# Patient Record
Sex: Female | Born: 1970 | Race: White | Hispanic: No | Marital: Married | Smoking: Former smoker
Health system: Southern US, Community
[De-identification: ages and names within clinical notes are randomized; demographics above are authoritative.]

## PROBLEM LIST (undated history)

## (undated) DIAGNOSIS — I219 Acute myocardial infarction, unspecified: Secondary | ICD-10-CM

## (undated) DIAGNOSIS — E7800 Pure hypercholesterolemia, unspecified: Secondary | ICD-10-CM

## (undated) DIAGNOSIS — I10 Essential (primary) hypertension: Secondary | ICD-10-CM

## (undated) HISTORY — PX: TUBAL LIGATION: SHX77

---

## 2011-05-28 ENCOUNTER — Inpatient Hospital Stay (HOSPITAL_COMMUNITY)
Admission: EM | Admit: 2011-05-28 | Discharge: 2011-05-30 | DRG: 282 | Disposition: A | Discharge status: Home / Self Care | Payer: Medicaid Other | Attending: Cardiology | Admitting: Cardiology

## 2011-05-28 ENCOUNTER — Emergency Department (HOSPITAL_COMMUNITY): Payer: Medicaid Other

## 2011-05-28 DIAGNOSIS — Z23 Encounter for immunization: Secondary | ICD-10-CM

## 2011-05-28 DIAGNOSIS — I214 Non-ST elevation (NSTEMI) myocardial infarction: Principal | ICD-10-CM | POA: Diagnosis present

## 2011-05-28 DIAGNOSIS — F17200 Nicotine dependence, unspecified, uncomplicated: Secondary | ICD-10-CM | POA: Diagnosis present

## 2011-05-28 DIAGNOSIS — I2510 Atherosclerotic heart disease of native coronary artery without angina pectoris: Secondary | ICD-10-CM | POA: Diagnosis present

## 2011-05-28 DIAGNOSIS — Z7902 Long term (current) use of antithrombotics/antiplatelets: Secondary | ICD-10-CM

## 2011-05-28 DIAGNOSIS — Z8249 Family history of ischemic heart disease and other diseases of the circulatory system: Secondary | ICD-10-CM

## 2011-05-28 DIAGNOSIS — Z79899 Other long term (current) drug therapy: Secondary | ICD-10-CM

## 2011-05-28 DIAGNOSIS — I498 Other specified cardiac arrhythmias: Secondary | ICD-10-CM | POA: Diagnosis present

## 2011-05-28 DIAGNOSIS — Z7982 Long term (current) use of aspirin: Secondary | ICD-10-CM

## 2011-05-28 DIAGNOSIS — E785 Hyperlipidemia, unspecified: Secondary | ICD-10-CM | POA: Diagnosis present

## 2011-05-28 LAB — COMPREHENSIVE METABOLIC PANEL
ALT: 10 U/L (ref 0–35)
AST: 20 U/L (ref 0–37)
Albumin: 3.9 g/dL (ref 3.5–5.2)
Alkaline Phosphatase: 55 U/L (ref 39–117)
Chloride: 103 mEq/L (ref 96–112)
Potassium: 3.3 mEq/L — ABNORMAL LOW (ref 3.5–5.1)
Sodium: 138 mEq/L (ref 135–145)
Total Bilirubin: 0.1 mg/dL — ABNORMAL LOW (ref 0.3–1.2)
Total Protein: 7.7 g/dL (ref 6.0–8.3)

## 2011-05-28 LAB — CBC
Hemoglobin: 10.8 g/dL — ABNORMAL LOW (ref 12.0–15.0)
MCH: 25.7 pg — ABNORMAL LOW (ref 26.0–34.0)
MCH: 26.4 pg (ref 26.0–34.0)
MCHC: 32.1 g/dL (ref 30.0–36.0)
MCV: 82.3 fL (ref 78.0–100.0)
Platelets: 266 10*3/uL (ref 150–400)
Platelets: 248 10*3/uL (ref 150–400)
RBC: 4.2 MIL/uL (ref 3.87–5.11)
WBC: 6.7 10*3/uL (ref 4.0–10.5)

## 2011-05-28 LAB — POCT I-STAT TROPONIN I
Troponin i, poc: 0.94 ng/mL (ref 0.00–0.08)
Troponin i, poc: 1.46 ng/mL (ref 0.00–0.08)

## 2011-05-28 LAB — CARDIAC PANEL(CRET KIN+CKTOT+MB+TROPI)
Relative Index: 8.2 — ABNORMAL HIGH (ref 0.0–2.5)
Total CK: 239 U/L — ABNORMAL HIGH (ref 7–177)
Troponin I: 4.4 ng/mL (ref <0.30)

## 2011-05-28 LAB — MRSA PCR SCREENING: MRSA by PCR: NEGATIVE

## 2011-05-28 LAB — DIFFERENTIAL
Basophils Relative: 0 % (ref 0–1)
Eosinophils Absolute: 0.1 10*3/uL (ref 0.0–0.7)
Lymphs Abs: 1.8 10*3/uL (ref 0.7–4.0)
Monocytes Absolute: 0.4 10*3/uL (ref 0.1–1.0)
Monocytes Relative: 7 % (ref 3–12)

## 2011-05-28 LAB — TSH: TSH: 5.418 u[IU]/mL — ABNORMAL HIGH (ref 0.350–4.500)

## 2011-05-28 LAB — PROTIME-INR
INR: 1 (ref 0.00–1.49)
Prothrombin Time: 13.4 seconds (ref 11.6–15.2)

## 2011-05-28 LAB — HEPARIN LEVEL (UNFRACTIONATED): Heparin Unfractionated: 0.24 IU/mL — ABNORMAL LOW (ref 0.30–0.70)

## 2011-05-29 LAB — LIPID PANEL
HDL: 42 mg/dL (ref >39)
LDL Cholesterol: 159 mg/dL — ABNORMAL HIGH (ref 0–99)
Total CHOL/HDL Ratio: 5.3 RATIO
Triglycerides: 111 mg/dL (ref <150)
VLDL: 22 mg/dL (ref 0–40)

## 2011-05-29 LAB — CARDIAC PANEL(CRET KIN+CKTOT+MB+TROPI)
Relative Index: 6.8 — ABNORMAL HIGH (ref 0.0–2.5)
Total CK: 165 U/L (ref 7–177)

## 2011-05-29 LAB — CBC
HCT: 34 % — ABNORMAL LOW (ref 36.0–46.0)
Hemoglobin: 10.7 g/dL — ABNORMAL LOW (ref 12.0–15.0)
MCV: 82.9 fL (ref 78.0–100.0)
RDW: 15.8 % — ABNORMAL HIGH (ref 11.5–15.5)
WBC: 7.5 10*3/uL (ref 4.0–10.5)

## 2011-05-29 LAB — BASIC METABOLIC PANEL
BUN: 10 mg/dL (ref 6–23)
CO2: 25 mEq/L (ref 19–32)
Chloride: 105 mEq/L (ref 96–112)
Glucose, Bld: 72 mg/dL (ref 70–99)
Potassium: 3.8 mEq/L (ref 3.5–5.1)
Sodium: 138 mEq/L (ref 135–145)

## 2011-05-30 LAB — CBC
HCT: 33.5 % — ABNORMAL LOW (ref 36.0–46.0)
Hemoglobin: 10.6 g/dL — ABNORMAL LOW (ref 12.0–15.0)
MCH: 26.1 pg (ref 26.0–34.0)
MCHC: 31.6 g/dL (ref 30.0–36.0)
MCV: 82.5 fL (ref 78.0–100.0)
RBC: 4.06 MIL/uL (ref 3.87–5.11)

## 2011-05-30 LAB — BASIC METABOLIC PANEL
CO2: 23 mEq/L (ref 19–32)
Calcium: 9.1 mg/dL (ref 8.4–10.5)
Potassium: 3.5 mEq/L (ref 3.5–5.1)
Sodium: 139 mEq/L (ref 135–145)

## 2011-05-30 LAB — CARDIAC PANEL(CRET KIN+CKTOT+MB+TROPI)
Relative Index: 3.5 — ABNORMAL HIGH (ref 0.0–2.5)
Total CK: 108 U/L (ref 7–177)

## 2011-06-01 NOTE — Cardiovascular Report (Signed)
Veronica Zamora, Veronica Zamora NO.:  [id number]  MEDICAL RECORD NO.:  [id number]  LOCATION:  2926                         FACILITY:  [facility]  PHYSICIAN:  Landry Corporal, MD DATE OF BIRTH:  19-Feb-1971  DATE OF PROCEDURE:  05/29/2011 DATE OF DISCHARGE:  05/30/2011                           CARDIAC CATHETERIZATION   PRIMARY CARDIOLOGIST:  Italy Hilty, MD  PERFORMING PHYSICIAN:  Landry Corporal, MD  PROCEDURE PERFORMED: 1. Left heart catheterization via 5-French right femoral artery     access. 2. Left ventriculography in the RAO projection, 11 mL contrast per     second 33 mL. 3. Native coronary angiography. 4. Intracoronary nitroglycerin injection of 200 mcg into the left     coronary artery system.  INDICATIONS:  Not-ST elevation MI.  BRIEF HISTORY:  Veronica Zamora is a very pleasant 40 year old woman with a history of smoking and family history of significant coronary artery disease who presented to [facility] Emergency Room with anginal-type chest pain and ruled in for a non-ST elevation MI with a troponin peak of roughly 4.  She had nonspecific ST-T changes and was pain-free upon evaluation in the emergency room.  She remained pain-free overnight, was treated with intravenous heparin and aspirin as well as beta-blockers and statin.  She is now referred for diagnostic angiography.  The risks, benefits, alternatives, and indications of the procedure were explained to the patient in detail.  Informed consent was obtained and a signed form placed on the chart.  PROCEDURE:  The patient was brought to the second floor [place] cardiac catheterization lab in fasting state.  She was prepped and draped in usual in sterile fashion with femoral artery access.  After time-out period was performed, the patient was sedated with intravenous Versed and fentanyl, and the right femoral head was localized using tactile and fluoroscopic guidance.  The right groin was  anesthetized using 1% subcutaneous lidocaine, and the right common femoral artery was accessed using modified Seldinger technique with placement of 5-French sheath.  After the sheath was aspirated and flushed,  first a 5-French JL-4 followed by 5-French JR-4 catheter was advanced over a wire. Multiple angiographic views of first the left and the right coronary arteries.  Then JR-4 catheter was exchanged over wire for a 5-French pigtail catheter, which was advanced across the aortic valve measuring the left ventricular hemodynamics.  A left ventriculogram was then performed in the RAO projection and then left ventricular hemodynamics were remeasured and the catheter was pulled back across the aortic valve for measuring pullback gradient.  Upon initial reviewing of the angiographic images, decision was made to reintroduce the JL4 catheter, so the pigtail catheter was changed for JL- 4 catheter and again a couple of additional angiography images of the left coronary system were obtained, and intracoronary nitroglycerin was administered prior to these evaluations.  After this, the catheter was removed completely out of the body over the wire, and the patient transferred to the holding area for sheath removal.  She was stable for during and after the procedure.  There were no complications.  ESTIMATED BLOOD LOSS:  Less 10 mL.  CATHETERIZATION STATISTICS: 1. Sedation was 2 mg of Versed and  50 mcg of fentanyl. 2. Total contrast is 140 mL.  HEMODYNAMICS: 1. Central aortic pressure 113/67 mmHg with a mean of 88 mmHg. 2. Left ventricular pressure 110/4 mmHg with an EDP of . 3. Left ventriculography demonstrated vigorous left ventricular     contraction with an ejection fraction of at least 65% with no     regional wall motion abnormalities.  ANGIOGRAPHIC FINDINGS: 1. Right coronary is a large caliber dominant vessel giving rise to     posterior ascending artery and posterolateral  branch.  Just in the     proximal segment, there is a somewhat eccentric lesion that appears     to be roughly 50% non-flow limiting that was potential for a     culprit lesion, but currently not flow limiting.  The remainder of     the vessel had no significant disease. 2. The left main is a moderate large-caliber vessel bifurcates     normally into an LAD and circumflex artery.  There is no     significant disease in this vessel. 3. Left anterior descending artery is a moderate-to-large caliber     vessel reached down and around the apex.  It gives rise to a very     small first diagonal branch, which does appear to be stumped off in     the early portion.  There is a similar branch of same caliber from     the proximal portion of the circumflex, which does have a     relatively large distribution of the covers, which would suggest     this is also another potential culprit for the MI.  There are two     additional diagonal branches and one major septal perforator as     well as several smaller branches with no significant disease in     rest of these vessels. 4. Circumflex is a large caliber vessel, but as I mentioned there is a    very small first obtuse marginal that is similar in caliber with a     very small first diagonal branch.  After a somewhat tortuous     course, this vessel becomes essentially trifurcating obtuse     marginal with a very very small atrioventricular groove branch.     There is no significant disease in this vessel.  IMPRESSION: 1. Moderate non-flow limiting plaque in the right coronary artery with     no inferior wall motion abnormality. 2. Another potential culprit lesion in the small first diagonal     second, which appears to be stumped off as this is a less than 1-mm     vessel. 3. Well-preserved ventricular function with no notable wall motion     abnormalities.  PLAN:  The patient will return back to her step-down bed, will be continued on the  cardiac medications.  We will continue her on anticoagulation overnight.  We will also continue her on dual antiplatelet therapy for least 1 month preferably for 1 year based on the CURE trial.  So, we will therefore start her on aspirin and Plavix.  We will likely transfer her to telemetry either this afternoon or in the morning and continue to titrate up her beta-blocker, statin, and ACE inhibitor.  We anticipate fast-track discharge.          ______________________________ Landry Corporal, MD     DWH/MEDQ  D:  05/30/2011  T:  05/31/2011  Job:  [id number]  cc:   Italy Hilty,  MD [facility]  Electronically Signed by Bryan Lemma MD on 06/01/2011 09:31:50 PM

## 2011-06-05 NOTE — H&P (Signed)
NAMEDANALEE, FLATH NO.:  [id number]  MEDICAL RECORD NO.:  [id number]  LOCATION:  [facility]                         FACILITY:  [facility]  PHYSICIAN:  Landry Corporal, MD DATE OF BIRTH:  10/29/1970  DATE OF ADMISSION:  05/28/2011 DATE OF DISCHARGE:                             HISTORY & PHYSICAL   CHIEF COMPLAINTS:  Chest pain and left arm pain.  HISTORY OF PRESENT ILLNESS:  Ms. Riva is a 40 year old female whose husband is a patient of Dr. Gaspar Garbe Little's.  She presented to the emergency room today with complaints of midsternal chest discomfort since last night.  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 She said she felt short of breath and "like I was going to pass out." She had symptoms of indigestion off and on all night.  She came to the emergency room today.  Her EKG shows nonspecific changes and her first troponin point-of-care marker is positive at 0.93.  She has multiple risk factors for coronary disease and is admitted now for further evaluation.  She continues to have a low level of midsternal chest discomfort.  PAST MEDICAL HISTORY:  Remarkable for dyslipidemia.  She has had no major surgeries and denies any history of diabetes.  She takes no medications at home except for a multivitamin.  SOCIAL HISTORY:  She is engaged to be married.  She has 2 children.  She smokes a pack a day.  There is no history of alcohol or drug abuse.  FAMILY HISTORY:  Remarkable that her father had an MI at 28, she also has a brother who had a stent placed at 83.  REVIEW OF SYSTEMS:  She says she felt fatigued lately.  She has not had reflux symptoms in the past and last night was the first time, she has ever had some indigestion-type symptoms.  She does wear glasses.  PHYSICAL EXAM:  VITAL SIGNS:  Blood pressure 122/60, pulse 78, temperature 98. GENERAL:  She is a well-developed,  well-nourished female in no acute distress. HEENT:  Normocephalic, atraumatic.  Extraocular movements intact. Sclarea are nonicteric. She wears glasses. NECK:  Without JVD or bruit. CHEST:  Clear to auscultation percussion. CARDIAC:  Regular rate and rhythm without murmur, rub, or gallop. Normal S1 and S2. ABDOMEN:  Nontender, nondistended.  No bruits. EXTREMITIES:  Without edema.  Distal pulses are intact, 3+/4 bilaterally.  There is no femoral artery bruits noted. NEUROLOGIC:  Grossly intact.  She is awake, alert, oriented, operative. Moves all extremities without obvious deficit. SKIN:  Cool and dry.  LABORATORY DATA:  White count 6.7, hemoglobin 10.8, hematocrit 34.3, platelets 266, troponin 0.94.  Sodium 138, potassium 3.3, BUN 18, creatinine 0.6.  Chest x-ray, portable showed a pericardial fat pad and a PA and lateral was recommended, this is pending.  The EKG shows sinus rhythm with poor anterior R-wave progression, but no acute changes.  IMPRESSION: 1. Unstable angina. 2. Untreated dyslipidemia. 3. Smoking. 4. A strong family history of coronary disease.  PLAN:  The patient will be started on IV heparin.  She is already on a nitro paste.  We will continue  to treat her as unstable angina, she will most likely need diagnostic catheterization which we could do today.     Abelino Derrick, P.A.   ______________________________ Landry Corporal, MD    LKK/MEDQ  D:  05/28/2011  T:  05/28/2011  Job:  [id number]  Electronically Signed by Corine Shelter P.A. on 06/05/2011 02:10:23 PM Electronically Signed by Bryan Lemma MD on 06/05/2011 10:40:44 PM

## 2011-06-05 NOTE — Discharge Summary (Signed)
Veronica Zamora, Veronica Zamora              ACCOUNT NO.:  [id number]  MEDICAL RECORD NO.:  [id number]  LOCATION:  2926                         FACILITY:  [facility]  PHYSICIAN:  Italy Tressy Kunzman, MD         DATE OF BIRTH:  [birth year]  DATE OF ADMISSION:  05/28/2011 DATE OF DISCHARGE:  05/30/2011                              DISCHARGE SUMMARY   DISCHARGE DIAGNOSES: 1. Non-ST-elevation myocardial infarction with peak troponin of 4.40,     CK-MB of 19.5. 2. Tobacco abuse. 3. Family history of coronary artery disease. 4. Dyslipidemia. 5. Bradycardia.  HOSPITAL COURSE:  Ms. Brunet is a 40 year old female with a history of dyslipidemia and tobacco use.  She presented to the emergency room with complaints of midsternal chest discomfort.  She was admitted to rule out acute coronary syndrome.  She was started on IV heparin and nitro paste. Troponin came back positive.  Initial POC troponin was 0.94 with a recheck at 4.40 and then trended down after that.  She was set up for cardiac catheterization which was completed May 29, 2011.  This revealed a 50% excentric stenosis in the RCA and 20% proximal LAD and 100% in a small D1 "that appears to be stump off."  The vessel appears to be less than 1-mm.  The ejection fraction 65%.  The patient continued after admission to be chest pain free with a nitro paste.  Tobacco cessation was initiated as well.  The patient will not be continued on an ACE inhibitor or beta-blocker due to hypotension and bradycardia. She will go home on Plavix, aspirin, Protonix and simvastatin.  She will also follow up with Dr. Herbie Baltimore in approximately 2 weeks and during that week I will watch and monitor.  LABORATORY DATA:  WBC 6.6, hemoglobin 10.6, hematocrit 33.5 and platelets 224.  Sodium 139, potassium 3.5, chloride 106, carbon dioxide 23, glucose 110, BUN 10, creatinine 0.55, calcium 9.1.  PT was 13.4, INR 1.00, PTT 29, activated clotting time 105, total cholesterol  223, triglycerides 111, HDL 42, LDL 159, VLDL 22, total cholesterol HDL ratio was 5.3.  TSH 5.418.  DISCHARGE PROCEDURES/STUDIES: 1. Chest x-ray May 28, 2011, showed no active disease.     Cardiomediastinal silhouette is stable.  No acute infiltrate or     pleural effusion.  No pulmonary edema. 2. Cardiac catheterization May 29, 2011, see above for details.  DISCHARGE MEDICATIONS: 1. Aspirin 81 mg 1 tablet by mouth daily. 2. Plavix 75 mg 1 tablet by mouth daily with meal. 3. Nitroglycerin sublingual 0.4 mg 1 tablet under the tongue every 5     minutes up to 3 doses for chest pain. 4. Protonix 40 mg 1 tablet by mouth daily at bedtime. 5. Simvastatin 40 mg 1 tablet by mouth daily. 6. Ferrous sulfate 325 mg 1 tablet by mouth daily. 7. Multivitamin 1 tablet by mouth daily.  DISPOSITION:  Ms. Farrelly was discharged home in stable condition.  It was recommended that she increase her activity slowly.  She may shower and bathe.  No lifting for 2 days.  No driving for 2 days.  She should need low-sodium heart-healthy diet.  If cath site becomes red, painful, swollen or  discharges fluid or pus, she should call our office.  She will follow up at [facility] for a monitor and then follow with Dr. Herbie Baltimore in 2 weeks from now.  It was also recommended she follow up with primary care physician with her mildly elevated TSH.    ______________________________ Wilburt Finlay, PA   ______________________________ [place] Nola Botkins, MD    BH/MEDQ  D:  05/30/2011  T:  05/30/2011  Job:  [id number]  cc:   Landry Corporal, MD  Electronically Signed by Wilburt Finlay PA on 05/31/2011 03:10:17 PM Electronically Signed by Kirtland Bouchard. Sharay Bellissimo M.D. on 06/05/2011 01:33:11 PM

## 2012-03-31 ENCOUNTER — Emergency Department (HOSPITAL_COMMUNITY)
Admission: EM | Admit: 2012-03-31 | Discharge: 2012-03-31 | Disposition: A | Discharge status: Home / Self Care | Payer: Self-pay | Attending: Emergency Medicine | Admitting: Emergency Medicine

## 2012-03-31 ENCOUNTER — Emergency Department (HOSPITAL_COMMUNITY): Payer: Self-pay

## 2012-03-31 ENCOUNTER — Encounter (HOSPITAL_COMMUNITY): Payer: Self-pay

## 2012-03-31 DIAGNOSIS — I10 Essential (primary) hypertension: Secondary | ICD-10-CM | POA: Insufficient documentation

## 2012-03-31 DIAGNOSIS — R0981 Nasal congestion: Secondary | ICD-10-CM

## 2012-03-31 DIAGNOSIS — F17200 Nicotine dependence, unspecified, uncomplicated: Secondary | ICD-10-CM | POA: Insufficient documentation

## 2012-03-31 DIAGNOSIS — H6980 Other specified disorders of Eustachian tube, unspecified ear: Secondary | ICD-10-CM

## 2012-03-31 DIAGNOSIS — R0602 Shortness of breath: Secondary | ICD-10-CM | POA: Insufficient documentation

## 2012-03-31 DIAGNOSIS — I252 Old myocardial infarction: Secondary | ICD-10-CM | POA: Insufficient documentation

## 2012-03-31 DIAGNOSIS — H6990 Unspecified Eustachian tube disorder, unspecified ear: Secondary | ICD-10-CM | POA: Insufficient documentation

## 2012-03-31 HISTORY — DX: Acute myocardial infarction, unspecified: I21.9

## 2012-03-31 HISTORY — DX: Essential (primary) hypertension: I10

## 2012-03-31 LAB — URINALYSIS, ROUTINE W REFLEX MICROSCOPIC
Glucose, UA: NEGATIVE mg/dL
Hgb urine dipstick: NEGATIVE
Leukocytes, UA: NEGATIVE
pH: 5.5 (ref 5.0–8.0)

## 2012-03-31 LAB — POCT I-STAT TROPONIN I: Troponin i, poc: 0 ng/mL (ref 0.00–0.08)

## 2012-03-31 LAB — CBC WITH DIFFERENTIAL/PLATELET
Eosinophils Absolute: 0 10*3/uL (ref 0.0–0.7)
Hemoglobin: 10.3 g/dL — ABNORMAL LOW (ref 12.0–15.0)
Lymphocytes Relative: 12 % (ref 12–46)
Lymphs Abs: 1.2 10*3/uL (ref 0.7–4.0)
MCH: 25.1 pg — ABNORMAL LOW (ref 26.0–34.0)
Monocytes Relative: 6 % (ref 3–12)
Neutro Abs: 7.8 10*3/uL — ABNORMAL HIGH (ref 1.7–7.7)
Neutrophils Relative %: 81 % — ABNORMAL HIGH (ref 43–77)
Platelets: 227 10*3/uL (ref 150–400)

## 2012-03-31 LAB — COMPREHENSIVE METABOLIC PANEL
ALT: 10 U/L (ref 0–35)
Albumin: 4.1 g/dL (ref 3.5–5.2)
Alkaline Phosphatase: 64 U/L (ref 39–117)
Chloride: 107 mEq/L (ref 96–112)
GFR calc Af Amer: >90 mL/min (ref >90)
Glucose, Bld: 111 mg/dL — ABNORMAL HIGH (ref 70–99)
Potassium: 2.9 mEq/L — ABNORMAL LOW (ref 3.5–5.1)
Sodium: 141 mEq/L (ref 135–145)
Total Bilirubin: 0.2 mg/dL — ABNORMAL LOW (ref 0.3–1.2)
Total Protein: 8.1 g/dL (ref 6.0–8.3)

## 2012-03-31 MED ORDER — SODIUM CHLORIDE 0.9 % IV BOLUS (SEPSIS)
1000.0000 mL | Freq: Once | INTRAVENOUS | Status: AC
  Administered 2012-03-31: 1000 mL via INTRAVENOUS

## 2012-03-31 MED ORDER — POTASSIUM CHLORIDE 10 MEQ/100ML IV SOLN
10.0000 meq | Freq: Once | INTRAVENOUS | Status: AC
  Administered 2012-03-31: 10 meq via INTRAVENOUS
  Filled 2012-03-31: qty 100

## 2012-03-31 MED ORDER — GUAIFENESIN ER 1200 MG PO TB12: 1.0000 | ORAL_TABLET | Freq: Two times a day (BID) | ORAL | Status: DC

## 2012-03-31 MED ORDER — PREDNISONE 50 MG PO TABS: 50.0000 mg | ORAL_TABLET | Freq: Every day | ORAL | Status: AC

## 2012-03-31 MED ORDER — POTASSIUM CHLORIDE CRYS ER 20 MEQ PO TBCR
40.0000 meq | EXTENDED_RELEASE_TABLET | Freq: Once | ORAL | Status: AC
  Administered 2012-03-31: 40 meq via ORAL
  Filled 2012-03-31: qty 2

## 2012-03-31 NOTE — ED Notes (Signed)
Pt wheezing also

## 2012-03-31 NOTE — ED Notes (Signed)
Pt states  She has been sob/congestion and sweating a lot for past couple of days states ears have had a full feeling and burns when she voids

## 2012-03-31 NOTE — ED Provider Notes (Signed)
History     CSN: [id number]  Arrival date & time 03/31/12  1258   First MD Initiated Contact with Patient 03/31/12 1444      Chief Complaint  Patient presents with  . Shortness of Breath  . Respiratory Distress  . Nausea    (Consider location/radiation/quality/duration/timing/severity/associated sxs/prior treatment) HPI Patient presents emergency department with nausea, trouble taking a deep breath and nasal congestion.  Patient also complains that her ears are full and she said and difficult to hear.  Patient, states that she has not had chest pain, vomiting, abdominal pain, weakness, blurred vision, dizziness, syncope, back pain, hemoptysis, or productive cough.  Patient, states she also has  not had sore throat, or fever.  Patient denies any medications prior to arrival.  Patient, states that her breathing is not labored, but she states, that she is having a difficult time taking a deep breath.   Past Medical History  Diagnosis Date  . Myocardial infarct   . Hypertension     History reviewed. No pertinent past surgical history.  History reviewed. No pertinent family history.  History  Substance Use Topics  . Smoking status: Current Everyday Smoker  . Smokeless tobacco: Not on file  . Alcohol Use: No    OB History    Grav Para Term Preterm Abortions TAB SAB Ect Mult Living                  Review of Systems All other systems negative except as documented in the HPI. All pertinent positives and negatives as reviewed in the HPI.  Allergies  Review of patient's allergies indicates no known allergies.  Home Medications   Current Outpatient Rx  Name Route Sig Dispense Refill  . BC HEADACHE PO Oral Take 1 Package by mouth daily as needed. For pain    . ADULT MULTIVITAMIN W/MINERALS CH Oral Take 1 tablet by mouth daily.      BP 113/73  Pulse 97  Temp 98.1 F (36.7 C) (Oral)  Resp 20  SpO2 97%  LMP 03/17/2012  Physical Exam  Constitutional: She is oriented  to person, place, and time. She appears well-developed and well-nourished. No distress.  HENT:  Head: Normocephalic and atraumatic.  Right Ear: Hearing, external ear and ear canal normal. Tympanic membrane is not injected. A middle ear effusion is present.  Left Ear: Hearing, external ear and ear canal normal. Tympanic membrane is not injected. A middle ear effusion is present.  Nose: Mucosal edema present.  Mouth/Throat: Uvula is midline and oropharynx is clear and moist.  Eyes: Pupils are equal, round, and reactive to light.  Neck: Normal range of motion. Neck supple.  Cardiovascular: Normal rate and regular rhythm.  Exam reveals no gallop and no friction rub.   No murmur heard. Pulmonary/Chest: Effort normal and breath sounds normal. No respiratory distress. She has no wheezes. She has no rales.  Neurological: She is alert and oriented to person, place, and time.  Skin: Skin is warm and dry. No rash noted.    ED Course  Procedures (including critical care time)  Labs Reviewed  CBC WITH DIFFERENTIAL - Abnormal; Notable for the following:    Hemoglobin 10.3 (*)     HCT 33.4 (*)     MCH 25.1 (*)     RDW 17.8 (*)     Neutrophils Relative 81 (*)     Neutro Abs 7.8 (*)     All other components within normal limits  COMPREHENSIVE METABOLIC PANEL -  Abnormal; Notable for the following:    Potassium 2.9 (*)     CO2 16 (*)     Glucose, Bld 111 (*)     Total Bilirubin 0.2 (*)     All other components within normal limits  URINALYSIS, ROUTINE W REFLEX MICROSCOPIC - Abnormal; Notable for the following:    Bilirubin Urine SMALL (*)     Ketones, ur 40 (*)     All other components within normal limits  CK TOTAL AND CKMB  POCT I-STAT TROPONIN I   Dg Chest 2 View  03/31/2012  *RADIOLOGY REPORT*  Clinical Data: Shortness of breath, respiratory distress  CHEST - 2 VIEW  Comparison: Chest x-ray of 05/28/2011  Findings: No active infiltrate or effusion is seen.  Very mild peribronchial  thickening is present.  Mediastinal contours are stable.  The heart is within upper limits of normal.  No bony abnormality is seen.  IMPRESSION: No active lung disease.  Mild peribronchial thickening.  Original Report Authenticated By: Juline Patch, M.D.    The patient will be treated for nasal congestion and eustachian tube dysfunction.  Patient does not have any signs of hypoxia and the patient does not complain of any chest pain, or hemoptysis.  Patient is PERC negative.  The patient is, advised to return here for any worsening in her condition.  Patient is, advised follow up with her primary care Dr. for, recheck.  Patient is given potassium IV here in the emergency room along with fluids.  Patient is an asking if she can go home as she feels no abnormalities at this time    MDM          Carlyle Dolly, PA-C 03/31/12 1908

## 2012-03-31 NOTE — ED Notes (Signed)
Pt complains of anuase, sob, and difficutly breathing, burning wth urination and hard of hearing. Onset last night.

## 2012-03-31 NOTE — ED Notes (Signed)
Bag meal given per pt request; IV fluids and K+ infusing without difficulty

## 2012-04-01 NOTE — ED Provider Notes (Signed)
Medical screening examination/treatment/procedure(s) were performed by non-physician practitioner and as supervising physician I was immediately available for consultation/collaboration.   Laray Anger, DO 04/01/12 1323

## 2012-07-15 ENCOUNTER — Emergency Department (HOSPITAL_COMMUNITY): Payer: Self-pay

## 2012-07-15 ENCOUNTER — Encounter (HOSPITAL_COMMUNITY): Payer: Self-pay | Admitting: *Deleted

## 2012-07-15 ENCOUNTER — Emergency Department (HOSPITAL_COMMUNITY)
Admission: EM | Admit: 2012-07-15 | Discharge: 2012-07-16 | Disposition: A | Discharge status: Home / Self Care | Payer: Self-pay | Attending: Emergency Medicine | Admitting: Emergency Medicine

## 2012-07-15 DIAGNOSIS — E7800 Pure hypercholesterolemia, unspecified: Secondary | ICD-10-CM | POA: Insufficient documentation

## 2012-07-15 DIAGNOSIS — IMO0002 Reserved for concepts with insufficient information to code with codable children: Secondary | ICD-10-CM | POA: Insufficient documentation

## 2012-07-15 DIAGNOSIS — I252 Old myocardial infarction: Secondary | ICD-10-CM | POA: Insufficient documentation

## 2012-07-15 DIAGNOSIS — Y929 Unspecified place or not applicable: Secondary | ICD-10-CM | POA: Insufficient documentation

## 2012-07-15 DIAGNOSIS — Y939 Activity, unspecified: Secondary | ICD-10-CM | POA: Insufficient documentation

## 2012-07-15 DIAGNOSIS — Y990 Civilian activity done for income or pay: Secondary | ICD-10-CM | POA: Insufficient documentation

## 2012-07-15 DIAGNOSIS — I10 Essential (primary) hypertension: Secondary | ICD-10-CM | POA: Insufficient documentation

## 2012-07-15 DIAGNOSIS — F17200 Nicotine dependence, unspecified, uncomplicated: Secondary | ICD-10-CM | POA: Insufficient documentation

## 2012-07-15 DIAGNOSIS — S92309A Fracture of unspecified metatarsal bone(s), unspecified foot, initial encounter for closed fracture: Secondary | ICD-10-CM | POA: Insufficient documentation

## 2012-07-15 HISTORY — DX: Pure hypercholesterolemia, unspecified: E78.00

## 2012-07-15 NOTE — ED Notes (Signed)
Pt to ED c/o pain and swelling to L foot after dropping box full of lotion on it. Swelling and bruising noted to L foot. Pt c/o increased pain to L side.

## 2012-07-15 NOTE — ED Provider Notes (Signed)
History     CSN: [id number]  Arrival date & time 07/15/12  2316   First MD Initiated Contact with Patient 07/15/12 2337      Chief Complaint  Patient presents with  . Foot Injury    (Consider location/radiation/quality/duration/timing/severity/associated sxs/prior treatment) HPI Comments: Patient states, a large box of hand motion, fell on her left foot.  Last night at work, approximately 2 AM she's had progressive swelling, and pain.  Since that, time, unable to bear weight fully on her foot she has taken some BC powder, prior to arrival with little pain relief  Patient is a 41 y.o. female presenting with foot injury. The history is provided by the patient.  Foot Injury  The incident occurred 12 to 24 hours ago. The incident occurred at work. The injury mechanism was a direct blow. Pertinent negatives include no numbness.    Past Medical History  Diagnosis Date  . Myocardial infarct   . Hypertension   . Hypercholesteremia     Past Surgical History  Procedure Date  . Tubal ligation     No family history on file.  History  Substance Use Topics  . Smoking status: Current Every Day Smoker -- 1.0 packs/day    Types: Cigarettes  . Smokeless tobacco: Not on file  . Alcohol Use: No    OB History    Grav Para Term Preterm Abortions TAB SAB Ect Mult Living                  Review of Systems  Constitutional: Negative for fever.  Respiratory: Negative for shortness of breath.   Cardiovascular: Negative for leg swelling.  Musculoskeletal: Positive for joint swelling.  Neurological: Negative for dizziness, weakness, numbness and headaches.    Allergies  Review of patient's allergies indicates no known allergies.  Home Medications   Current Outpatient Rx  Name Route Sig Dispense Refill  . BC HEADACHE PO Oral Take 1 Package by mouth daily as needed. For pain    . HYDROCODONE-ACETAMINOPHEN 5-500 MG PO TABS Oral Take 1-2 tablets by mouth every 6 (six) hours as needed  for pain. 15 tablet 0    BP 147/75  Pulse 91  Temp 98.1 F (36.7 C)  Resp 16  SpO2 98%  LMP 07/08/2012  Physical Exam  Constitutional: She appears well-developed and well-nourished.  HENT:  Head: Normocephalic.  Eyes: Pupils are equal, round, and reactive to light.  Neck: Normal range of motion.  Cardiovascular: Normal rate.   Pulmonary/Chest: Effort normal.  Musculoskeletal: She exhibits edema and tenderness.       Feet:       Tender ecchymotic, and swollen  Neurological: She is alert.  Skin: Skin is warm.    ED Course  Procedures (including critical care time)  Labs Reviewed - No data to display Dg Foot Complete Left  07/15/2012  *RADIOLOGY REPORT*  Clinical Data: Trauma to foot.  Pain in the fourth and fifth metatarsals.  LEFT FOOT - COMPLETE 3+ VIEW  Comparison: None.  Findings: An oblique fracture is present in the distal fifth metatarsal.  Associated soft tissue swelling is present.  The joints are located. A small plantar calcaneal spur is evident.  IMPRESSION:  1.  Oblique fracture of the distal fifth metatarsal. 2.  Associated soft tissue swelling.   Original Report Authenticated By: Jamesetta Orleans. MATTERN, M.D.      1. Metatarsal bone fracture       MDM   Discussed injury with patient and significant  other provided him with a copy of the x-ray did demonstrate a fracture.  Will contact Dr. Shelle Iron for guidance in immobilization  Consulted with Dr. Shelle Iron, who agrees with Yetta Barre wrap nonweightbearing, crutches, and office followup      Arman Filter, NP 07/16/12 0019  Arman Filter, NP 07/16/12 1610

## 2012-07-16 MED ORDER — HYDROCODONE-ACETAMINOPHEN 5-500 MG PO TABS: 1.0000 | ORAL_TABLET | Freq: Four times a day (QID) | ORAL | Status: DC | PRN

## 2012-07-16 MED ORDER — HYDROCODONE-ACETAMINOPHEN 5-325 MG PO TABS
1.0000 | ORAL_TABLET | Freq: Once | ORAL | Status: AC
  Administered 2012-07-16: 1 via ORAL
  Filled 2012-07-16: qty 1

## 2012-07-16 NOTE — ED Provider Notes (Signed)
Medical screening examination/treatment/procedure(s) were performed by non-physician practitioner and as supervising physician I was immediately available for consultation/collaboration.  Garmon Dehn M Yashica Sterbenz, MD 07/16/12 0537 

## 2012-07-16 NOTE — Progress Notes (Signed)
Orthopedic Tech Progress Note Patient Details:  Veronica Zamora August 12, 1971 [id number]  Ortho Devices Type of Ortho Device: Lenora Boys splint;Crutches   Haskell Flirt 07/16/2012, 12:16 AM

## 2013-04-29 ENCOUNTER — Emergency Department (HOSPITAL_COMMUNITY)
Admission: EM | Admit: 2013-04-29 | Discharge: 2013-04-29 | Disposition: A | Discharge status: Home / Self Care | Payer: BC Managed Care – PPO | Attending: Emergency Medicine | Admitting: Emergency Medicine

## 2013-04-29 ENCOUNTER — Encounter (HOSPITAL_COMMUNITY): Payer: Self-pay | Admitting: *Deleted

## 2013-04-29 ENCOUNTER — Emergency Department (HOSPITAL_COMMUNITY): Payer: BC Managed Care – PPO

## 2013-04-29 DIAGNOSIS — Z862 Personal history of diseases of the blood and blood-forming organs and certain disorders involving the immune mechanism: Secondary | ICD-10-CM | POA: Insufficient documentation

## 2013-04-29 DIAGNOSIS — M7660 Achilles tendinitis, unspecified leg: Secondary | ICD-10-CM | POA: Insufficient documentation

## 2013-04-29 DIAGNOSIS — I10 Essential (primary) hypertension: Secondary | ICD-10-CM | POA: Insufficient documentation

## 2013-04-29 DIAGNOSIS — M7662 Achilles tendinitis, left leg: Secondary | ICD-10-CM

## 2013-04-29 DIAGNOSIS — Z79899 Other long term (current) drug therapy: Secondary | ICD-10-CM | POA: Insufficient documentation

## 2013-04-29 DIAGNOSIS — M7732 Calcaneal spur, left foot: Secondary | ICD-10-CM

## 2013-04-29 DIAGNOSIS — F17200 Nicotine dependence, unspecified, uncomplicated: Secondary | ICD-10-CM | POA: Insufficient documentation

## 2013-04-29 DIAGNOSIS — I252 Old myocardial infarction: Secondary | ICD-10-CM | POA: Insufficient documentation

## 2013-04-29 DIAGNOSIS — Z8639 Personal history of other endocrine, nutritional and metabolic disease: Secondary | ICD-10-CM | POA: Insufficient documentation

## 2013-04-29 DIAGNOSIS — M7730 Calcaneal spur, unspecified foot: Secondary | ICD-10-CM | POA: Insufficient documentation

## 2013-04-29 MED ORDER — NAPROXEN 500 MG PO TABS: 500.0000 mg | ORAL_TABLET | Freq: Two times a day (BID) | ORAL | Status: DC

## 2013-04-29 NOTE — ED Provider Notes (Signed)
CSN: [id number]     Arrival date & time 04/29/13  1722 History    This chart was scribed for Felicie Morn, NP working with Lyanne Co, MD by Quintella Reichert, ED Scribe. This patient was seen in room TR05C/TR05C and the patient's care was started at 6:26 PM.     Chief Complaint  Patient presents with  . Foot Pain    Patient is a 42 y.o. female presenting with lower extremity pain. The history is provided by the patient and medical records. No language interpreter was used.  Foot Pain This is a new problem. Episode onset: 1 week ago. The problem has been gradually worsening. Pertinent negatives include no chest pain, no abdominal pain, no headaches and no shortness of breath. Exacerbated by: bearing weight. Nothing relieves the symptoms. She has tried nothing for the symptoms.    HPI Comments: Veronica Zamora is a 42 y.o. female who presents to the Emergency Department complaining of moderate progressively-worsening left heel pain that began one week ago.  Pain is localized to the back of her left heel and described as "like someone kicked my heel."   It is exacerbated by bearing weight.  Pt is ambulatory with pain.  She also states that when she puts on a shoe on that foot her shoe feels tight.    She denies injuries or unusual activities that may have brought on pain.  She denies weakness, numbness or tingling.  Pt sustained an oblique fracture to the left distal fifth metatarsal 8 months ago.  She denies any problems with that foot since that time and states her present pain is in an entirely different area.    Past Medical History  Diagnosis Date  . Myocardial infarct   . Hypertension   . Hypercholesteremia     Past Surgical History  Procedure Laterality Date  . Tubal ligation      History reviewed. No pertinent family history.   History  Substance Use Topics  . Smoking status: Current Every Day Smoker -- 1.00 packs/day    Types: Cigarettes  . Smokeless tobacco: Not on  file  . Alcohol Use: No    OB History   Grav Para Term Preterm Abortions TAB SAB Ect Mult Living                   Review of Systems  Respiratory: Negative for shortness of breath.   Cardiovascular: Negative for chest pain.  Gastrointestinal: Negative for abdominal pain.  Musculoskeletal:       Left heel pain  Neurological: Negative for weakness, numbness and headaches.  All other systems reviewed and are negative.      Allergies  Review of patient's allergies indicates no known allergies.  Home Medications   Current Outpatient Rx  Name  Route  Sig  Dispense  Refill  . Aspirin-Salicylamide-Caffeine (BC HEADACHE PO)   Oral   Take 1 Package by mouth daily as needed. For pain         . Multiple Vitamin (MULTI-VITAMIN DAILY PO)   Oral   Take 1 tablet by mouth daily.          BP 146/88  Pulse 77  Temp(Src) 98.3 F (36.8 C) (Oral)  Resp 16  SpO2 100%  Physical Exam  Nursing note and vitals reviewed. Constitutional: She is oriented to person, place, and time. She appears well-developed and well-nourished. No distress.  HENT:  Head: Normocephalic and atraumatic.  Eyes: EOM are normal.  Neck: Neck supple.  No tracheal deviation present.  Cardiovascular: Normal rate, regular rhythm and normal heart sounds.   No murmur heard. Pulmonary/Chest: Effort normal. No respiratory distress. She has no wheezes. She has no rales.  Musculoskeletal: Normal range of motion. She exhibits tenderness.       Feet:  Neurological: She is alert and oriented to person, place, and time.  Skin: Skin is warm and dry.  Psychiatric: She has a normal mood and affect. Her behavior is normal.    ED Course  Procedures (including critical care time)  DIAGNOSTIC STUDIES: Oxygen Saturation is 100% on room air, normal by my interpretation.    COORDINATION OF CARE: 6:30 PM-Discussed treatment plan which includes imaging with pt at bedside and pt agreed to plan.     Dg Foot 2 Views  Left  04/29/2013   *RADIOLOGY REPORT*  Clinical Data: Left heel pain.  LEFT FOOT - 2 VIEW  Comparison: 07/15/2012.  Findings: Plate and screws are noted on the fifth metatarsal related to a previous fracture and fixation.  No complicating features.  Flattening of the third metatarsal head is likely the sequela of AVN.  This is stable.  No new/acute bony findings.  A moderate sized calcaneal heel spur is noted along with mild pes cavus deformity.  IMPRESSION:  1.  No acute bony findings. 2.  Remote healed fifth metatarsal fracture and remote AVN involving the third metatarsal head. 3.  Moderate sized calcaneal heel spur.   Original Report Authenticated By: Rudie Meyer, M.D.    1. Heel spur, left   2. Achilles tendonitis, left      MDM  New onset of heel pain, no known injury.  Heel spur noted on foot film.  Pain along achilles tendon.  Will treat as tendonitis.  Anti-inflammatory, PCP/ortho follow-up.    I personally performed the services described in this documentation, which was scribed in my presence. The recorded information has been reviewed and is accurate.    Jimmye Norman, NP 04/29/13 [phone]

## 2013-04-29 NOTE — ED Notes (Signed)
To ED, ambulatory, for eval of left heel/foot pain. No injury noted

## 2013-05-02 NOTE — ED Provider Notes (Signed)
Medical screening examination/treatment/procedure(s) were performed by non-physician practitioner and as supervising physician I was immediately available for consultation/collaboration.   Janisse Ghan M Vuong Musa, MD 05/02/13 1429 

## 2013-08-23 ENCOUNTER — Emergency Department (HOSPITAL_COMMUNITY): Payer: BC Managed Care – PPO

## 2013-08-23 ENCOUNTER — Encounter (HOSPITAL_COMMUNITY): Payer: Self-pay | Admitting: Emergency Medicine

## 2013-08-23 DIAGNOSIS — R0789 Other chest pain: Secondary | ICD-10-CM | POA: Insufficient documentation

## 2013-08-23 DIAGNOSIS — Z79899 Other long term (current) drug therapy: Secondary | ICD-10-CM | POA: Insufficient documentation

## 2013-08-23 DIAGNOSIS — I252 Old myocardial infarction: Secondary | ICD-10-CM | POA: Insufficient documentation

## 2013-08-23 DIAGNOSIS — R0602 Shortness of breath: Secondary | ICD-10-CM | POA: Insufficient documentation

## 2013-08-23 DIAGNOSIS — F17200 Nicotine dependence, unspecified, uncomplicated: Secondary | ICD-10-CM | POA: Insufficient documentation

## 2013-08-23 DIAGNOSIS — E7800 Pure hypercholesterolemia, unspecified: Secondary | ICD-10-CM | POA: Insufficient documentation

## 2013-08-23 DIAGNOSIS — J209 Acute bronchitis, unspecified: Secondary | ICD-10-CM | POA: Insufficient documentation

## 2013-08-23 DIAGNOSIS — I10 Essential (primary) hypertension: Secondary | ICD-10-CM | POA: Insufficient documentation

## 2013-08-23 DIAGNOSIS — R062 Wheezing: Secondary | ICD-10-CM | POA: Insufficient documentation

## 2013-08-23 NOTE — ED Notes (Signed)
Pt. reports persistent dry cough with chest congestion for several months , denies fever or chills.

## 2013-08-24 ENCOUNTER — Emergency Department (HOSPITAL_COMMUNITY)
Admission: EM | Admit: 2013-08-24 | Discharge: 2013-08-24 | Disposition: A | Discharge status: Home / Self Care | Payer: BC Managed Care – PPO | Attending: Emergency Medicine | Admitting: Emergency Medicine

## 2013-08-24 DIAGNOSIS — J40 Bronchitis, not specified as acute or chronic: Secondary | ICD-10-CM

## 2013-08-24 MED ORDER — ALBUTEROL SULFATE HFA 108 (90 BASE) MCG/ACT IN AERS
2.0000 | INHALATION_SPRAY | RESPIRATORY_TRACT | Status: DC | PRN
  Administered 2013-08-24: 2 via RESPIRATORY_TRACT
  Filled 2013-08-24: qty 6.7

## 2013-08-24 MED ORDER — GUAIFENESIN ER 600 MG PO TB12: 1200.0000 mg | ORAL_TABLET | Freq: Two times a day (BID) | ORAL | Status: DC

## 2013-08-24 MED ORDER — PREDNISONE 10 MG PO TABS: 20.0000 mg | ORAL_TABLET | Freq: Every day | ORAL | Status: DC

## 2013-08-24 MED ORDER — BENZONATATE 100 MG PO CAPS
200.0000 mg | ORAL_CAPSULE | Freq: Once | ORAL | Status: AC
  Administered 2013-08-24: 200 mg via ORAL
  Filled 2013-08-24: qty 2

## 2013-08-24 MED ORDER — PREDNISONE 20 MG PO TABS
60.0000 mg | ORAL_TABLET | Freq: Once | ORAL | Status: AC
  Administered 2013-08-24: 60 mg via ORAL
  Filled 2013-08-24: qty 3

## 2013-08-24 MED ORDER — AZITHROMYCIN 250 MG PO TABS: ORAL_TABLET | ORAL | Status: DC

## 2013-08-24 NOTE — ED Provider Notes (Signed)
CSN: [id number]     Arrival date & time 08/23/13  2256 History   First MD Initiated Contact with Patient 08/24/13 0231     Chief Complaint  Patient presents with  . Cough   HPI  History provided by the patient. Patient is a 42 year old female with history of hypertension, hypercholesterolemia and is a one pack-a-day smoker who presents with complaints of persistent dry cough. Patient states her symptoms first began towards the end of October with "colds". That time she had symptoms of congestion, rhinorrhea, sore throat and coughing. She reports feeling sick for several days to one week. She then felt much better however her cough has been persistent since that time. She reports cough is mostly a dry cough she feels a deep congestion in her throat she can't bring up. She has used over-the-counter cough medicine without any improvement. She denies any associated fever, chills or sweats. She denies any chest pain or shortness of breath. She has not had any increased respiratory activity. No swelling of the extremities. No other aggravating or alleviating factors. No other symptoms.     Past Medical History  Diagnosis Date  . Myocardial infarct   . Hypertension   . Hypercholesteremia    Past Surgical History  Procedure Laterality Date  . Tubal ligation     No family history on file. History  Substance Use Topics  . Smoking status: Current Every Day Smoker -- 1.00 packs/day    Types: Cigarettes  . Smokeless tobacco: Not on file  . Alcohol Use: No   OB History   Grav Para Term Preterm Abortions TAB SAB Ect Mult Living                 Review of Systems  Constitutional: Negative for fever, chills and diaphoresis.  HENT: Negative for congestion.   Respiratory: Positive for cough, shortness of breath and wheezing.   Cardiovascular: Negative for chest pain and palpitations.  Gastrointestinal: Negative for nausea, vomiting and diarrhea.  All other systems reviewed and are  negative.    Allergies  Review of patient's allergies indicates no known allergies.  Home Medications   Current Outpatient Rx  Name  Route  Sig  Dispense  Refill  . Aspirin-Salicylamide-Caffeine (BC HEADACHE PO)   Oral   Take 1 Package by mouth daily as needed. For pain         . Multiple Vitamin (MULTI-VITAMIN DAILY PO)   Oral   Take 1 tablet by mouth daily.          BP 138/86  Pulse 99  Temp(Src) 98.6 F (37 C) (Oral)  Resp 18  Ht 5\' 1"  (1.549 m)  Wt 153 lb (69.4 kg)  BMI 28.92 kg/m2  SpO2 98% Physical Exam  Nursing note and vitals reviewed. Constitutional: She is oriented to person, place, and time. She appears well-developed and well-nourished. No distress.  HENT:  Head: Normocephalic.  Right Ear: Tympanic membrane normal.  Left Ear: Tympanic membrane normal.  Mouth/Throat: Oropharynx is clear and moist.  Eyes: Conjunctivae are normal.  Neck: Normal range of motion. Neck supple. No JVD present.  Cardiovascular: Normal rate and regular rhythm.   Pulmonary/Chest: Effort normal. No stridor. No respiratory distress. She has wheezes. She has no rales. She exhibits no tenderness.  Slight coarse expiratory wheezes.  Abdominal: Soft. There is no tenderness.  Musculoskeletal: Normal range of motion. She exhibits no edema and no tenderness.  Neurological: She is alert and oriented to person, place, and time.  Skin: Skin is warm and dry. No rash noted.  Psychiatric: She has a normal mood and affect. Her behavior is normal.    ED Course  Procedures  Patient seen and evaluated. She appears well with normal respirations and O2 sats. No sign and. X-ray unremarkable without signs of pneumonia. Given patient's significant smoking history slight wheezing. Suspect she has some continued bronchitis symptoms. She may also be at risk for early chronic bronchitis. I did discuss this with patient and the need to quit smoking as well as PCP followup. At this time we'll treat for  possible bronchitis with albuterol, Z-Pak, Mucinex and prednisone. Patient agrees with plan.   Imaging Review Dg Chest 2 View  08/23/2013   CLINICAL DATA:  Cough, congestion.  Chest pressure.  EXAM: CHEST  2 VIEW  COMPARISON:  03/31/2012  FINDINGS: Heart size is normal. Lungs are clear. No pulmonary edema. Visualized osseous structures have a normal appearance.  IMPRESSION: No active cardiopulmonary disease.   Electronically Signed   By: Rosalie Gums M.D.   On: 08/23/2013 23:48     MDM   1. Bronchitis        Angus Seller, PA-C 08/24/13 2342

## 2013-08-24 NOTE — ED Notes (Signed)
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  Nasal produces yellow-green for past 1.5 weeks.  Smokes <1 ppd x 15-20 years.

## 2013-08-25 NOTE — ED Provider Notes (Signed)
Medical screening examination/treatment/procedure(s) were performed by non-physician practitioner and as supervising physician I was immediately available for consultation/collaboration.    Kaylanni Ezelle M Nasia Cannan, MD 08/25/13 0409 

## 2014-01-06 ENCOUNTER — Observation Stay (HOSPITAL_COMMUNITY)
Admission: EM | Admit: 2014-01-06 | Discharge: 2014-01-07 | Disposition: A | Discharge status: Home / Self Care | Payer: BC Managed Care – PPO | Attending: General Surgery | Admitting: General Surgery

## 2014-01-06 ENCOUNTER — Emergency Department (HOSPITAL_COMMUNITY): Payer: BC Managed Care – PPO

## 2014-01-06 ENCOUNTER — Encounter (HOSPITAL_COMMUNITY): Payer: Self-pay | Admitting: Emergency Medicine

## 2014-01-06 ENCOUNTER — Emergency Department (HOSPITAL_COMMUNITY): Payer: BC Managed Care – PPO | Admitting: Certified Registered"

## 2014-01-06 ENCOUNTER — Encounter (HOSPITAL_COMMUNITY): Payer: BC Managed Care – PPO | Admitting: Certified Registered"

## 2014-01-06 ENCOUNTER — Encounter (HOSPITAL_COMMUNITY)
Admission: EM | Disposition: A | Discharge status: Home / Self Care | Payer: Self-pay | Source: Home / Self Care | Attending: Emergency Medicine

## 2014-01-06 DIAGNOSIS — I2510 Atherosclerotic heart disease of native coronary artery without angina pectoris: Secondary | ICD-10-CM | POA: Insufficient documentation

## 2014-01-06 DIAGNOSIS — K37 Unspecified appendicitis: Secondary | ICD-10-CM

## 2014-01-06 DIAGNOSIS — F17200 Nicotine dependence, unspecified, uncomplicated: Secondary | ICD-10-CM | POA: Insufficient documentation

## 2014-01-06 DIAGNOSIS — I10 Essential (primary) hypertension: Secondary | ICD-10-CM | POA: Insufficient documentation

## 2014-01-06 DIAGNOSIS — R109 Unspecified abdominal pain: Secondary | ICD-10-CM

## 2014-01-06 DIAGNOSIS — E7800 Pure hypercholesterolemia, unspecified: Secondary | ICD-10-CM | POA: Insufficient documentation

## 2014-01-06 DIAGNOSIS — I252 Old myocardial infarction: Secondary | ICD-10-CM | POA: Insufficient documentation

## 2014-01-06 DIAGNOSIS — K3580 Unspecified acute appendicitis: Principal | ICD-10-CM | POA: Insufficient documentation

## 2014-01-06 HISTORY — PX: LAPAROSCOPIC APPENDECTOMY: SHX408

## 2014-01-06 LAB — CBC WITH DIFFERENTIAL/PLATELET
BASOS PCT: 1 % (ref 0–1)
Basophils Absolute: 0 10*3/uL (ref 0.0–0.1)
EOS PCT: 1 % (ref 0–5)
Eosinophils Absolute: 0.1 10*3/uL (ref 0.0–0.7)
HEMATOCRIT: 35.3 % — AB (ref 36.0–46.0)
Hemoglobin: 10.7 g/dL — ABNORMAL LOW (ref 12.0–15.0)
LYMPHS PCT: 22 % (ref 12–46)
Lymphs Abs: 1.2 10*3/uL (ref 0.7–4.0)
MCH: 25.9 pg — ABNORMAL LOW (ref 26.0–34.0)
MCHC: 30.3 g/dL (ref 30.0–36.0)
MCV: 85.5 fL (ref 78.0–100.0)
MONO ABS: 0.4 10*3/uL (ref 0.1–1.0)
Monocytes Relative: 7 % (ref 3–12)
Neutro Abs: 3.8 10*3/uL (ref 1.7–7.7)
Neutrophils Relative %: 69 % (ref 43–77)
Platelets: 228 10*3/uL (ref 150–400)
RBC: 4.13 MIL/uL (ref 3.87–5.11)
RDW: 17.8 % — ABNORMAL HIGH (ref 11.5–15.5)
WBC: 5.5 10*3/uL (ref 4.0–10.5)

## 2014-01-06 LAB — COMPREHENSIVE METABOLIC PANEL
ALT: 8 U/L (ref 0–35)
AST: 18 U/L (ref 0–37)
Albumin: 3.6 g/dL (ref 3.5–5.2)
Alkaline Phosphatase: 50 U/L (ref 39–117)
BUN: 10 mg/dL (ref 6–23)
CHLORIDE: 103 meq/L (ref 96–112)
CO2: 21 mEq/L (ref 19–32)
CREATININE: 0.55 mg/dL (ref 0.50–1.10)
Calcium: 8.8 mg/dL (ref 8.4–10.5)
Glucose, Bld: 91 mg/dL (ref 70–99)
Potassium: 3.8 mEq/L (ref 3.7–5.3)
Sodium: 138 mEq/L (ref 137–147)
Total Protein: 7.5 g/dL (ref 6.0–8.3)

## 2014-01-06 LAB — URINALYSIS, ROUTINE W REFLEX MICROSCOPIC
Bilirubin Urine: NEGATIVE
Glucose, UA: NEGATIVE mg/dL
Hgb urine dipstick: NEGATIVE
KETONES UR: NEGATIVE mg/dL
LEUKOCYTES UA: NEGATIVE
NITRITE: NEGATIVE
PROTEIN: NEGATIVE mg/dL
Specific Gravity, Urine: 1.011 (ref 1.005–1.030)
UROBILINOGEN UA: 0.2 mg/dL (ref 0.0–1.0)
pH: 6 (ref 5.0–8.0)

## 2014-01-06 LAB — PREGNANCY, URINE: Preg Test, Ur: NEGATIVE

## 2014-01-06 LAB — LIPASE, BLOOD: Lipase: 19 U/L (ref 11–59)

## 2014-01-06 SURGERY — APPENDECTOMY, LAPAROSCOPIC
Anesthesia: General | Site: Abdomen

## 2014-01-06 MED ORDER — DEXAMETHASONE SODIUM PHOSPHATE 4 MG/ML IJ SOLN
INTRAMUSCULAR | Status: DC | PRN
  Administered 2014-01-06: 4 mg via INTRAVENOUS

## 2014-01-06 MED ORDER — EPHEDRINE SULFATE 50 MG/ML IJ SOLN
INTRAMUSCULAR | Status: DC | PRN
  Administered 2014-01-06 (×2): 10 mg via INTRAVENOUS

## 2014-01-06 MED ORDER — MORPHINE SULFATE 4 MG/ML IJ SOLN
4.0000 mg | Freq: Once | INTRAMUSCULAR | Status: AC
  Administered 2014-01-06: 4 mg via INTRAVENOUS
  Filled 2014-01-06: qty 1

## 2014-01-06 MED ORDER — PROPOFOL 10 MG/ML IV BOLUS
INTRAVENOUS | Status: DC | PRN
  Administered 2014-01-06: 200 mg via INTRAVENOUS

## 2014-01-06 MED ORDER — MIDAZOLAM HCL 5 MG/5ML IJ SOLN
INTRAMUSCULAR | Status: DC | PRN
  Administered 2014-01-06: 2 mg via INTRAVENOUS

## 2014-01-06 MED ORDER — SODIUM CHLORIDE 0.45 % IV BOLUS: 1000.0000 mL | Freq: Once | INTRAVENOUS | Status: DC

## 2014-01-06 MED ORDER — MIDAZOLAM HCL 2 MG/2ML IJ SOLN
INTRAMUSCULAR | Status: AC
  Filled 2014-01-06: qty 2

## 2014-01-06 MED ORDER — ONDANSETRON HCL 4 MG/2ML IJ SOLN
INTRAMUSCULAR | Status: DC | PRN
  Administered 2014-01-06: 4 mg via INTRAVENOUS

## 2014-01-06 MED ORDER — NEOSTIGMINE METHYLSULFATE 1 MG/ML IJ SOLN
INTRAMUSCULAR | Status: AC
  Filled 2014-01-06: qty 10

## 2014-01-06 MED ORDER — ONDANSETRON HCL 4 MG/2ML IJ SOLN
4.0000 mg | Freq: Once | INTRAMUSCULAR | Status: AC
  Administered 2014-01-06: 4 mg via INTRAVENOUS
  Filled 2014-01-06: qty 2

## 2014-01-06 MED ORDER — PROPOFOL 10 MG/ML IV BOLUS
INTRAVENOUS | Status: AC
  Filled 2014-01-06: qty 20

## 2014-01-06 MED ORDER — SODIUM CHLORIDE 0.9 % IR SOLN
Status: DC | PRN
  Administered 2014-01-06: 1000 mL

## 2014-01-06 MED ORDER — LIDOCAINE HCL (CARDIAC) 20 MG/ML IV SOLN
INTRAVENOUS | Status: AC
  Filled 2014-01-06: qty 5

## 2014-01-06 MED ORDER — SUFENTANIL CITRATE 50 MCG/ML IV SOLN
INTRAVENOUS | Status: AC
  Filled 2014-01-06: qty 1

## 2014-01-06 MED ORDER — SUCCINYLCHOLINE CHLORIDE 20 MG/ML IJ SOLN
INTRAMUSCULAR | Status: AC
  Filled 2014-01-06: qty 1

## 2014-01-06 MED ORDER — LIDOCAINE HCL (CARDIAC) 20 MG/ML IV SOLN
INTRAVENOUS | Status: DC | PRN
  Administered 2014-01-06: 100 mg via INTRAVENOUS

## 2014-01-06 MED ORDER — ROCURONIUM BROMIDE 100 MG/10ML IV SOLN
INTRAVENOUS | Status: DC | PRN
  Administered 2014-01-06: 20 mg via INTRAVENOUS

## 2014-01-06 MED ORDER — SODIUM CHLORIDE 0.9 % IV BOLUS (SEPSIS)
1000.0000 mL | Freq: Once | INTRAVENOUS | Status: AC
  Administered 2014-01-06: 1000 mL via INTRAVENOUS

## 2014-01-06 MED ORDER — IOHEXOL 300 MG/ML  SOLN
80.0000 mL | Freq: Once | INTRAMUSCULAR | Status: AC | PRN
  Administered 2014-01-06: 80 mL via INTRAVENOUS

## 2014-01-06 MED ORDER — ONDANSETRON HCL 4 MG/2ML IJ SOLN
INTRAMUSCULAR | Status: AC
  Filled 2014-01-06: qty 2

## 2014-01-06 MED ORDER — ROCURONIUM BROMIDE 50 MG/5ML IV SOLN
INTRAVENOUS | Status: AC
  Filled 2014-01-06: qty 1

## 2014-01-06 MED ORDER — DEXAMETHASONE SODIUM PHOSPHATE 4 MG/ML IJ SOLN
INTRAMUSCULAR | Status: AC
  Filled 2014-01-06: qty 1

## 2014-01-06 MED ORDER — GLYCOPYRROLATE 0.2 MG/ML IJ SOLN
INTRAMUSCULAR | Status: AC
  Filled 2014-01-06: qty 2

## 2014-01-06 MED ORDER — SUCCINYLCHOLINE CHLORIDE 20 MG/ML IJ SOLN
INTRAMUSCULAR | Status: DC | PRN
  Administered 2014-01-06: 100 mg via INTRAVENOUS

## 2014-01-06 MED ORDER — SODIUM CHLORIDE 0.9 % IV SOLN
INTRAVENOUS | Status: DC | PRN
  Administered 2014-01-06 (×2): via INTRAVENOUS

## 2014-01-06 MED ORDER — BUPIVACAINE-EPINEPHRINE (PF) 0.25% -1:200000 IJ SOLN
INTRAMUSCULAR | Status: AC
  Filled 2014-01-06: qty 30

## 2014-01-06 MED ORDER — SUFENTANIL CITRATE 50 MCG/ML IV SOLN
INTRAVENOUS | Status: DC | PRN
Start: 2014-01-06 — End: 2014-01-07
  Administered 2014-01-06: 20 ug via INTRAVENOUS
  Administered 2014-01-06: 10 ug via INTRAVENOUS

## 2014-01-06 MED ORDER — SODIUM CHLORIDE 0.9 % IJ SOLN
INTRAMUSCULAR | Status: AC
  Filled 2014-01-06: qty 10

## 2014-01-06 MED ORDER — PIPERACILLIN-TAZOBACTAM 3.375 G IVPB 30 MIN
3.3750 g | Freq: Once | INTRAVENOUS | Status: AC
  Administered 2014-01-06: 3.375 g via INTRAVENOUS
  Filled 2014-01-06: qty 50

## 2014-01-06 SURGICAL SUPPLY — 46 items
APPLIER CLIP ROT 10 11.4 M/L (STAPLE)
BENZOIN TINCTURE PRP APPL 2/3 (GAUZE/BANDAGES/DRESSINGS) IMPLANT
BLADE SURG ROTATE 9660 (MISCELLANEOUS) IMPLANT
CANISTER SUCTION 2500CC (MISCELLANEOUS) ×3 IMPLANT
CHLORAPREP W/TINT 26ML (MISCELLANEOUS) ×3 IMPLANT
CLIP APPLIE ROT 10 11.4 M/L (STAPLE) IMPLANT
COVER SURGICAL LIGHT HANDLE (MISCELLANEOUS) ×3 IMPLANT
CUTTER FLEX LINEAR 45M (STAPLE) ×3 IMPLANT
DECANTER SPIKE VIAL GLASS SM (MISCELLANEOUS) ×3 IMPLANT
DERMABOND ADHESIVE PROPEN (GAUZE/BANDAGES/DRESSINGS) ×2
DERMABOND ADVANCED (GAUZE/BANDAGES/DRESSINGS)
DERMABOND ADVANCED .7 DNX12 (GAUZE/BANDAGES/DRESSINGS) IMPLANT
DERMABOND ADVANCED .7 DNX6 (GAUZE/BANDAGES/DRESSINGS) ×1 IMPLANT
DRAPE UTILITY 15X26 W/TAPE STR (DRAPE) ×6 IMPLANT
ELECT REM PT RETURN 9FT ADLT (ELECTROSURGICAL) ×3
ELECTRODE REM PT RTRN 9FT ADLT (ELECTROSURGICAL) ×1 IMPLANT
ENDOLOOP SUT PDS II  0 18 (SUTURE)
ENDOLOOP SUT PDS II 0 18 (SUTURE) IMPLANT
GAUZE SPONGE 2X2 8PLY STRL LF (GAUZE/BANDAGES/DRESSINGS) IMPLANT
GLOVE BIOGEL M STRL SZ7.5 (GLOVE) ×3 IMPLANT
GLOVE BIOGEL PI IND STRL 8 (GLOVE) ×1 IMPLANT
GLOVE BIOGEL PI INDICATOR 8 (GLOVE) ×2
GOWN STRL REUS W/ TWL LRG LVL3 (GOWN DISPOSABLE) ×2 IMPLANT
GOWN STRL REUS W/ TWL XL LVL3 (GOWN DISPOSABLE) ×1 IMPLANT
GOWN STRL REUS W/TWL LRG LVL3 (GOWN DISPOSABLE) ×4
GOWN STRL REUS W/TWL XL LVL3 (GOWN DISPOSABLE) ×2
KIT BASIN OR (CUSTOM PROCEDURE TRAY) ×3 IMPLANT
KIT ROOM TURNOVER OR (KITS) ×3 IMPLANT
NS IRRIG 1000ML POUR BTL (IV SOLUTION) ×3 IMPLANT
PAD ARMBOARD 7.5X6 YLW CONV (MISCELLANEOUS) ×6 IMPLANT
POUCH SPECIMEN RETRIEVAL 10MM (ENDOMECHANICALS) ×3 IMPLANT
RELOAD 45 VASCULAR/THIN (ENDOMECHANICALS) ×3 IMPLANT
RELOAD STAPLE TA45 3.5 REG BLU (ENDOMECHANICALS) IMPLANT
SCALPEL HARMONIC ACE (MISCELLANEOUS) ×3 IMPLANT
SCISSORS LAP 5X35 DISP (ENDOMECHANICALS) IMPLANT
SET IRRIG TUBING LAPAROSCOPIC (IRRIGATION / IRRIGATOR) ×3 IMPLANT
SPECIMEN JAR SMALL (MISCELLANEOUS) ×3 IMPLANT
SPONGE GAUZE 2X2 STER 10/PKG (GAUZE/BANDAGES/DRESSINGS)
SUT MNCRL AB 4-0 PS2 18 (SUTURE) ×3 IMPLANT
SUT VICRYL 0 UR6 27IN ABS (SUTURE) IMPLANT
TOWEL OR 17X24 6PK STRL BLUE (TOWEL DISPOSABLE) ×3 IMPLANT
TOWEL OR 17X26 10 PK STRL BLUE (TOWEL DISPOSABLE) ×3 IMPLANT
TRAY FOLEY CATH 16FR SILVER (SET/KITS/TRAYS/PACK) ×3 IMPLANT
TRAY LAPAROSCOPIC (CUSTOM PROCEDURE TRAY) ×3 IMPLANT
TROCAR XCEL BLADELESS 5X75MML (TROCAR) ×6 IMPLANT
TROCAR XCEL BLUNT TIP 100MML (ENDOMECHANICALS) ×3 IMPLANT

## 2014-01-06 NOTE — Anesthesia Procedure Notes (Signed)
Procedure Name: Intubation Date/Time: 01/06/2014 11:02 PM Performed by: Claris Che Pre-anesthesia Checklist: Patient identified, Emergency Drugs available, Suction available and Patient being monitored Patient Re-evaluated:Patient Re-evaluated prior to inductionOxygen Delivery Method: Circle system utilized and Simple face mask Preoxygenation: Pre-oxygenation with 100% oxygen Intubation Type: IV induction, Rapid sequence and Cricoid Pressure applied Ventilation: Mask ventilation without difficulty and Oral airway inserted - appropriate to patient size Laryngoscope Size: Mac and 3 Grade View: Grade I Tube type: Oral Tube size: 7.5 mm Number of attempts: 1 Airway Equipment and Method: Stylet and LTA kit utilized Placement Confirmation: ETT inserted through vocal cords under direct vision,  positive ETCO2 and breath sounds checked- equal and bilateral Secured at: 23 cm Tube secured with: Tape Dental Injury: Teeth and Oropharynx as per pre-operative assessment

## 2014-01-06 NOTE — ED Provider Notes (Signed)
CSN: [id number]     Arrival date & time 01/06/14  1516 History   First MD Initiated Contact with Patient 01/06/14 1842     Chief Complaint  Patient presents with  . Abdominal Pain     (Consider location/radiation/quality/duration/timing/severity/associated sxs/prior Treatment) HPI Comments: The patient is a 43 year old female with a past medical history of MI s/p stent 2 years ago, hypertension, tobacco abuse presenting to the emergency department with right lower abdominal pain. She reports a sudden onset of periumbilical pain 2 days ago.  She reports discomfort localizing to right lower quadrant, and has been dull for one day. She reports symptom relief with pressure. Pain is worse by hitting bumps in the road. Reports holding pressure to the abdomen partially resolves the discomfort. Reports associated nausea without emesis. She reports she fell she was constipated, took a laxative yesterday had multiple bowel movements. Last bowel movement was yesterday nonbloody. Reports previous tubal ligation, no other history of abdominal surgeries, history of kidney stone. Patient's last menstrual period was 12/30/2013.  The history is provided by the patient. No language interpreter was used.    Past Medical History  Diagnosis Date  . Myocardial infarct   . Hypertension   . Hypercholesteremia    Past Surgical History  Procedure Laterality Date  . Tubal ligation     Family History  Problem Relation Age of Onset  . Diabetes Father   . Heart failure Father   . Hypertension Father    History  Substance Use Topics  . Smoking status: Current Every Day Smoker -- 1.00 packs/day    Types: Cigarettes  . Smokeless tobacco: Not on file  . Alcohol Use: No   OB History   Grav Para Term Preterm Abortions TAB SAB Ect Mult Living                 Review of Systems  Constitutional: Negative for fever and chills.  Respiratory: Negative for cough and shortness of breath.   Cardiovascular: Negative  for chest pain.  Gastrointestinal: Positive for nausea and abdominal pain. Negative for vomiting, diarrhea, constipation, blood in stool and rectal pain.  Genitourinary: Negative for dysuria, hematuria, vaginal bleeding and vaginal discharge.  All other systems reviewed and are negative.     Allergies  Review of patient's allergies indicates no known allergies.  Home Medications   Prior to Admission medications   Medication Sig Start Date End Date Taking? Authorizing Provider  Aspirin-Salicylamide-Caffeine (BC HEADACHE PO) Take 1 Package by mouth daily as needed. For pain   Yes Historical Provider, MD  Multiple Vitamin (MULTI-VITAMIN DAILY PO) Take 1 tablet by mouth daily.   Yes Historical Provider, MD   BP 155/91  Pulse 84  Temp(Src) 98.3 F (36.8 C) (Oral)  Resp 18  Ht 5\' 2"  (1.575 m)  Wt 148 lb (67.132 kg)  BMI 27.06 kg/m2  SpO2 98% Physical Exam  Nursing note and vitals reviewed. Constitutional: She is oriented to person, place, and time. She appears well-developed and well-nourished. No distress.  HENT:  Head: Normocephalic and atraumatic.  Eyes: EOM are normal. Pupils are equal, round, and reactive to light. No scleral icterus.  Neck: Normal range of motion. Neck supple.  Cardiovascular: Normal rate, regular rhythm and normal heart sounds.   No murmur heard. Pulmonary/Chest: Effort normal and breath sounds normal. No respiratory distress. She has no wheezes. She has no rales. She exhibits no tenderness.  Abdominal: Soft. Normal appearance. She exhibits no distension. Bowel sounds are increased. There  is tenderness in the right lower quadrant. There is rebound and tenderness at McBurney's point. There is no guarding, no CVA tenderness and negative Murphy's sign.    Musculoskeletal: Normal range of motion. She exhibits no edema.  Neurological: She is alert and oriented to person, place, and time.  Skin: Skin is warm and dry. No rash noted. She is not diaphoretic.    Psychiatric: She has a normal mood and affect. Her behavior is normal.    ED Course  Procedures (including critical care time) Labs Review Labs Reviewed  CBC WITH DIFFERENTIAL - Abnormal; Notable for the following:    Hemoglobin 10.7 (*)    HCT 35.3 (*)    MCH 25.9 (*)    RDW 17.8 (*)    All other components within normal limits  COMPREHENSIVE METABOLIC PANEL - Abnormal; Notable for the following:    Total Bilirubin <0.2 (*)    All other components within normal limits  LIPASE, BLOOD  URINALYSIS, ROUTINE W REFLEX MICROSCOPIC    Imaging Review Ct Abdomen Pelvis W Contrast  01/06/2014   CLINICAL DATA:  Abdominal pain  EXAM: CT ABDOMEN AND PELVIS WITH CONTRAST  TECHNIQUE: Multidetector CT imaging of the abdomen and pelvis was performed using the standard protocol following bolus administration of intravenous contrast.  CONTRAST:  80mL OMNIPAQUE IOHEXOL 300 MG/ML  SOLN  COMPARISON:  None.  FINDINGS: Benign hemangioma in the left lobe of the liver. Tiny hypodensity in the right lobe on image 26 is nonspecific.  Gallbladder, spleen, pancreas, adrenal glands, and kidneys are within normal limits.  There is nonspecific stranding involving the fat surrounding the celiac.  The appendix is thickened and prominent. There is stranding in the adjacent fat. Findings are compatible with acute appendicitis. No extraluminal bowel gas. No abscess.  Uterus and adnexa are within normal limits. Bladder is distended. Degenerative changes at L5-S1.  IMPRESSION: Acute appendicitis. Critical Value/emergent results were called by telephone at the time of interpretation on 01/06/2014 at 8:38 PM to Dr. Mellody DrownLAUREN Eldred Sooy , who verbally acknowledged these results.  Nonspecific stranding in the fat surrounding the celiac axis. An inflammatory process is not excluded.   Electronically Signed   By: Maryclare BeanArt  Hoss M.D.   On: 01/06/2014 20:38    Date: 01/06/2014  Rate: 65   Rhythm: normal sinus rhythm  QRS Axis: normal  Intervals:  normal  ST/T Wave abnormalities: normal  Conduction Disutrbances:none  Narrative Interpretation:   Old EKG Reviewed:        MDM   Final diagnoses:  Appendicitis   Patient presents with persistant right lower quadrant pain. Will evaluate for an acute appendicitis. UA without abnormalities, CBC without leukocytosis, hemoglobin 10.7. CMP within normal limits. CT shows an acute appendicitis. Discussed patient history and CT results with Dr. Andrey CampanileWilson. Agrees to evaluate the patient in the emergency department. Will start Zosyn for antibiotics.  Meds given in ED:  Medications  piperacillin-tazobactam (ZOSYN) IVPB 3.375 g (not administered)  morphine 4 MG/ML injection 4 mg (4 mg Intravenous Given 01/06/14 1929)  ondansetron (ZOFRAN) injection 4 mg (4 mg Intravenous Given 01/06/14 1929)  sodium chloride 0.9 % bolus 1,000 mL (1,000 mLs Intravenous New Bag/Given 01/06/14 1942)  iohexol (OMNIPAQUE) 300 MG/ML solution 80 mL (80 mLs Intravenous Contrast Given 01/06/14 2014)    New Prescriptions   No medications on file        Clabe SealLauren M Marcelino Campos, PA-C 01/07/14 1137

## 2014-01-06 NOTE — ED Notes (Signed)
Pt reports RLQ pain since 4/21. Reports pain worse with palpation and movement. Reports nausea, but denies emesis, diarrhea. Denies urinary syx. NAD. Denies pain at this time.

## 2014-01-06 NOTE — Anesthesia Preprocedure Evaluation (Signed)
Anesthesia Evaluation  Patient identified by MRN, date of birth, ID band Patient awake    Reviewed: Allergy & Precautions, H&P , NPO status , Patient's Chart, lab work & pertinent test results  Airway       Dental   Pulmonary Current Smoker,          Cardiovascular hypertension, + CAD and + Past MI     Neuro/Psych    GI/Hepatic   Endo/Other    Renal/GU      Musculoskeletal   Abdominal   Peds  Hematology   Anesthesia Other Findings   Reproductive/Obstetrics                           Anesthesia Physical Anesthesia Plan  ASA: III  Anesthesia Plan: General   Post-op Pain Management:    Induction: Intravenous  Airway Management Planned: Oral ETT  Additional Equipment:   Intra-op Plan:   Post-operative Plan: Extubation in OR  Informed Consent: I have reviewed the patients History and Physical, chart, labs and discussed the procedure including the risks, benefits and alternatives for the proposed anesthesia with the patient or authorized representative who has indicated his/her understanding and acceptance.     Plan Discussed with:   Anesthesia Plan Comments:         Anesthesia Quick Evaluation

## 2014-01-06 NOTE — H&P (Signed)
Veronica Zamora is an 43 y.o. female.   Chief Complaint: abd pain HPI: 43 year old Caucasian female presents to the emergency room after acute onset of periumbilical pain around 2 AM on Tuesday morning. The pain acutely worsened at 7 AM the following morning prompting her to leave work early. She states the pain has been intermittent but there is always concern of abdominal discomfort in her lower abdomen. She initially thought it may be an ulcer however the pain stays localized in her lower abdomen. She describes it as a dull ache. The pain worsened and is now focused mainly in her right lower abdomen. She denies any prior symptoms. She did have nausea yesterday. Her last bowel movement was yesterday. She does smoke about a pack a day. She denies taking blood thinners. She has had a tubal ligation. She came to the emergency room and underwent evaluation including a CT scan which revealed acute appendicitis.  She hasn't seen a physician in several years. She states that she did followup with a cardiologist after her discharge in 2012 after her myocardial infarction.  Past Medical History  Diagnosis Date  . Myocardial infarct   . Hypertension   . Hypercholesteremia     Past Surgical History  Procedure Laterality Date  . Tubal ligation      Family History  Problem Relation Age of Onset  . Diabetes Father   . Heart failure Father   . Hypertension Father    Social History:  reports that she has been smoking Cigarettes.  She has been smoking about 1.00 pack per day. She does not have any smokeless tobacco history on file. She reports that she does not drink alcohol or use illicit drugs.  Allergies: No Known Allergies   (Not in a hospital admission)  Results for orders placed during the hospital encounter of 01/06/14 (from the past 48 hour(s))  CBC WITH DIFFERENTIAL     Status: Abnormal   Collection Time    01/06/14  3:31 PM      Result Value Ref Range   WBC 5.5  4.0 - 10.5 K/uL   RBC  4.13  3.87 - 5.11 MIL/uL   Hemoglobin 10.7 (*) 12.0 - 15.0 g/dL   HCT 35.3 (*) 36.0 - 46.0 %   MCV 85.5  78.0 - 100.0 fL   MCH 25.9 (*) 26.0 - 34.0 pg   MCHC 30.3  30.0 - 36.0 g/dL   RDW 17.8 (*) 11.5 - 15.5 %   Platelets 228  150 - 400 K/uL   Neutrophils Relative % 69  43 - 77 %   Neutro Abs 3.8  1.7 - 7.7 K/uL   Lymphocytes Relative 22  12 - 46 %   Lymphs Abs 1.2  0.7 - 4.0 K/uL   Monocytes Relative 7  3 - 12 %   Monocytes Absolute 0.4  0.1 - 1.0 K/uL   Eosinophils Relative 1  0 - 5 %   Eosinophils Absolute 0.1  0.0 - 0.7 K/uL   Basophils Relative 1  0 - 1 %   Basophils Absolute 0.0  0.0 - 0.1 K/uL  COMPREHENSIVE METABOLIC PANEL     Status: Abnormal   Collection Time    01/06/14  3:31 PM      Result Value Ref Range   Sodium 138  137 - 147 mEq/L   Potassium 3.8  3.7 - 5.3 mEq/L   Chloride 103  96 - 112 mEq/L   CO2 21  19 - 32 mEq/L  Glucose, Bld 91  70 - 99 mg/dL   BUN 10  6 - 23 mg/dL   Creatinine, Ser 0.55  0.50 - 1.10 mg/dL   Calcium 8.8  8.4 - 10.5 mg/dL   Total Protein 7.5  6.0 - 8.3 g/dL   Albumin 3.6  3.5 - 5.2 g/dL   AST 18  0 - 37 U/L   ALT 8  0 - 35 U/L   Alkaline Phosphatase 50  39 - 117 U/L   Total Bilirubin <0.2 (*) 0.3 - 1.2 mg/dL   GFR calc non Af Amer >90  >90 mL/min   GFR calc Af Amer >90  >90 mL/min   Comment: (NOTE)     The eGFR has been calculated using the CKD EPI equation.     This calculation has not been validated in all clinical situations.     eGFR's persistently <90 mL/min signify possible Chronic Kidney     Disease.  LIPASE, BLOOD     Status: None   Collection Time    01/06/14  3:31 PM      Result Value Ref Range   Lipase 19  11 - 59 U/L  URINALYSIS, ROUTINE W REFLEX MICROSCOPIC     Status: None   Collection Time    01/06/14  3:54 PM      Result Value Ref Range   Color, Urine YELLOW  YELLOW   APPearance CLEAR  CLEAR   Specific Gravity, Urine 1.011  1.005 - 1.030   pH 6.0  5.0 - 8.0   Glucose, UA NEGATIVE  NEGATIVE mg/dL   Hgb  urine dipstick NEGATIVE  NEGATIVE   Bilirubin Urine NEGATIVE  NEGATIVE   Ketones, ur NEGATIVE  NEGATIVE mg/dL   Protein, ur NEGATIVE  NEGATIVE mg/dL   Urobilinogen, UA 0.2  0.0 - 1.0 mg/dL   Nitrite NEGATIVE  NEGATIVE   Leukocytes, UA NEGATIVE  NEGATIVE   Comment: MICROSCOPIC NOT DONE ON URINES WITH NEGATIVE PROTEIN, BLOOD, LEUKOCYTES, NITRITE, OR GLUCOSE <1000 mg/dL.  PREGNANCY, URINE     Status: None   Collection Time    01/06/14  3:54 PM      Result Value Ref Range   Preg Test, Ur NEGATIVE  NEGATIVE   Comment:            THE SENSITIVITY OF THIS     METHODOLOGY IS >20 mIU/mL.   Ct Abdomen Pelvis W Contrast  01/06/2014   CLINICAL DATA:  Abdominal pain  EXAM: CT ABDOMEN AND PELVIS WITH CONTRAST  TECHNIQUE: Multidetector CT imaging of the abdomen and pelvis was performed using the standard protocol following bolus administration of intravenous contrast.  CONTRAST:  62m OMNIPAQUE IOHEXOL 300 MG/ML  SOLN  COMPARISON:  None.  FINDINGS: Benign hemangioma in the left lobe of the liver. Tiny hypodensity in the right lobe on image 26 is nonspecific.  Gallbladder, spleen, pancreas, adrenal glands, and kidneys are within normal limits.  There is nonspecific stranding involving the fat surrounding the celiac.  The appendix is thickened and prominent. There is stranding in the adjacent fat. Findings are compatible with acute appendicitis. No extraluminal bowel gas. No abscess.  Uterus and adnexa are within normal limits. Bladder is distended. Degenerative changes at L5-S1.  IMPRESSION: Acute appendicitis. Critical Value/emergent results were called by telephone at the time of interpretation on 01/06/2014 at 8:38 PM to Dr. LHarvie Heck, who verbally acknowledged these results.  Nonspecific stranding in the fat surrounding the celiac axis. An inflammatory process is not  excluded.   Electronically Signed   By: Maryclare Bean M.D.   On: 01/06/2014 20:38    Review of Systems  Constitutional: Negative for weight  loss.  HENT: Negative for nosebleeds.   Eyes: Negative for blurred vision.  Respiratory: Negative for shortness of breath.   Cardiovascular: Negative for chest pain, palpitations, orthopnea, leg swelling and PND.       Denies DOE  Gastrointestinal: Positive for nausea and abdominal pain. Negative for heartburn, vomiting and blood in stool.  Genitourinary: Negative for dysuria and hematuria.  Musculoskeletal: Negative.   Skin: Negative for itching and rash.  Neurological: Negative for dizziness, focal weakness, seizures, loss of consciousness and headaches.       Denies TIAs, amaurosis fugax  Endo/Heme/Allergies: Does not bruise/bleed easily.  Psychiatric/Behavioral: The patient is not nervous/anxious.     Blood pressure 123/79, pulse 76, temperature 97.8 F (36.6 C), temperature source Oral, resp. rate 16, height _0  (1.575 m), weight 148 lb (67.132 kg), last menstrual period 12/30/2013, SpO2 95.00%. Physical Exam  Vitals reviewed. Constitutional: She is oriented to person, place, and time. She appears well-developed and well-nourished. No distress.  HENT:  Head: Normocephalic and atraumatic.  Right Ear: External ear normal.  Left Ear: External ear normal.  Eyes: Conjunctivae are normal. No scleral icterus.  Neck: Normal range of motion. Neck supple. No tracheal deviation present. No thyromegaly present.  Cardiovascular: Normal rate and normal heart sounds.   Respiratory: Effort normal and breath sounds normal. No stridor. No respiratory distress. She has no wheezes.  GI: Soft. Normal appearance. She exhibits no distension. Bowel sounds are decreased. There is tenderness in the right lower quadrant. There is guarding. There is no rigidity and no rebound.    Musculoskeletal: She exhibits no edema and no tenderness.  Lymphadenopathy:    She has no cervical adenopathy.  Neurological: She is alert and oriented to person, place, and time. She exhibits normal muscle tone.  Skin: Skin  is warm and dry. No rash noted. She is not diaphoretic. No erythema. No pallor.  Psychiatric: She has a normal mood and affect. Her behavior is normal. Judgment and thought content normal.     Assessment/Plan Acute appendicitis Tobacco use History of myocardial infarction Coronary artery disease  We discussed the etiology and management of acute appendicitis. We discussed operative and nonoperative management. Her current cardiac review of systems is negative so I think it is safe to proceed with surgery  I recommended operative management along with IV antibiotics.  We discussed laparoscopic appendectomy. We discussed the risk and benefits of surgery including but not limited to bleeding, infection, injury to surrounding structures, need to convert to an open procedure, blood clot formation, post operative abscess or wound infection, staple line complications such as leak or bleeding, hernia formation, post operative ileus, need for additional procedures, anesthesia complications, and the typical postoperative course. I explained that the patient should expect a good improvement in their symptoms.  Leighton Ruff. Redmond Pulling, MD, FACS General, Bariatric, & Minimally Invasive Surgery [facility] [place]   Gayland Curry 01/06/2014, 9:23 PM

## 2014-01-07 ENCOUNTER — Encounter (HOSPITAL_COMMUNITY): Payer: Self-pay | Admitting: *Deleted

## 2014-01-07 DIAGNOSIS — K3580 Unspecified acute appendicitis: Secondary | ICD-10-CM | POA: Diagnosis present

## 2014-01-07 LAB — CBC
HCT: 34.2 % — ABNORMAL LOW (ref 36.0–46.0)
Hemoglobin: 10.1 g/dL — ABNORMAL LOW (ref 12.0–15.0)
MCH: 25.6 pg — ABNORMAL LOW (ref 26.0–34.0)
MCHC: 29.5 g/dL — AB (ref 30.0–36.0)
MCV: 86.8 fL (ref 78.0–100.0)
Platelets: 218 10*3/uL (ref 150–400)
RBC: 3.94 MIL/uL (ref 3.87–5.11)
RDW: 18 % — ABNORMAL HIGH (ref 11.5–15.5)
WBC: 6.9 10*3/uL (ref 4.0–10.5)

## 2014-01-07 LAB — BASIC METABOLIC PANEL
BUN: 7 mg/dL (ref 6–23)
CO2: 24 mEq/L (ref 19–32)
Calcium: 8.3 mg/dL — ABNORMAL LOW (ref 8.4–10.5)
Chloride: 106 mEq/L (ref 96–112)
Creatinine, Ser: 0.56 mg/dL (ref 0.50–1.10)
GFR calc Af Amer: >90 mL/min (ref >90)
Glucose, Bld: 129 mg/dL — ABNORMAL HIGH (ref 70–99)
POTASSIUM: 4.6 meq/L (ref 3.7–5.3)
SODIUM: 140 meq/L (ref 137–147)

## 2014-01-07 MED ORDER — ENOXAPARIN SODIUM 40 MG/0.4ML ~~LOC~~ SOLN: 40.0000 mg | SUBCUTANEOUS | Status: DC

## 2014-01-07 MED ORDER — BUPIVACAINE-EPINEPHRINE 0.25% -1:200000 IJ SOLN
INTRAMUSCULAR | Status: DC | PRN
  Administered 2014-01-07: 25 mL

## 2014-01-07 MED ORDER — KCL IN DEXTROSE-NACL 20-5-0.45 MEQ/L-%-% IV SOLN
INTRAVENOUS | Status: DC
  Administered 2014-01-07: 02:00:00 via INTRAVENOUS
  Filled 2014-01-07 (×3): qty 1000

## 2014-01-07 MED ORDER — NEOSTIGMINE METHYLSULFATE 1 MG/ML IJ SOLN
INTRAMUSCULAR | Status: DC | PRN
  Administered 2014-01-06: 3 mg via INTRAVENOUS

## 2014-01-07 MED ORDER — HYDROMORPHONE HCL PF 1 MG/ML IJ SOLN
0.2500 mg | INTRAMUSCULAR | Status: DC | PRN
Start: 2014-01-07 — End: 2014-01-07
  Administered 2014-01-07: 0.5 mg via INTRAVENOUS

## 2014-01-07 MED ORDER — ACETAMINOPHEN 325 MG PO TABS: 650.0000 mg | ORAL_TABLET | ORAL | Status: DC | PRN

## 2014-01-07 MED ORDER — OXYCODONE-ACETAMINOPHEN 5-325 MG PO TABS: 1.0000 | ORAL_TABLET | ORAL | Status: DC | PRN

## 2014-01-07 MED ORDER — ONDANSETRON HCL 4 MG/2ML IJ SOLN: 4.0000 mg | Freq: Once | INTRAMUSCULAR | Status: DC | PRN

## 2014-01-07 MED ORDER — PANTOPRAZOLE SODIUM 40 MG IV SOLR
40.0000 mg | INTRAVENOUS | Status: DC
  Administered 2014-01-07: 40 mg via INTRAVENOUS
  Filled 2014-01-07 (×2): qty 40

## 2014-01-07 MED ORDER — MORPHINE SULFATE 2 MG/ML IJ SOLN
1.0000 mg | INTRAMUSCULAR | Status: DC | PRN
  Administered 2014-01-07: 2 mg via INTRAVENOUS
  Filled 2014-01-07: qty 1

## 2014-01-07 MED ORDER — ONDANSETRON HCL 4 MG PO TABS: 4.0000 mg | ORAL_TABLET | Freq: Four times a day (QID) | ORAL | Status: DC | PRN

## 2014-01-07 MED ORDER — HYDROMORPHONE HCL PF 1 MG/ML IJ SOLN
INTRAMUSCULAR | Status: AC
  Filled 2014-01-07: qty 1

## 2014-01-07 MED ORDER — GLYCOPYRROLATE 0.2 MG/ML IJ SOLN
INTRAMUSCULAR | Status: DC | PRN
  Administered 2014-01-06: 0.4 mg via INTRAVENOUS

## 2014-01-07 MED ORDER — ONDANSETRON HCL 4 MG/2ML IJ SOLN: 4.0000 mg | Freq: Four times a day (QID) | INTRAMUSCULAR | Status: DC | PRN

## 2014-01-07 NOTE — Discharge Summary (Signed)
I have seen and examined the patient and agree with the assessment and plans.  Murna Backer A. Jeanluc Wegman  MD, FACS  

## 2014-01-07 NOTE — Transfer of Care (Signed)
Immediate Anesthesia Transfer of Care Note  Patient: Veronica Zamora  Procedure(s) Performed: Procedure(s): APPENDECTOMY LAPAROSCOPIC (N/A)  Patient Location: PACU  Anesthesia Type:General  Level of Consciousness: sedated, patient cooperative and responds to stimulation  Airway & Oxygen Therapy: Patient Spontanous Breathing and Patient connected to nasal cannula oxygen  Post-op Assessment: Report given to PACU RN, Post -op Vital signs reviewed and stable and Patient moving all extremities X 4  Post vital signs: Reviewed and stable  Complications: No apparent anesthesia complications

## 2014-01-07 NOTE — Progress Notes (Signed)
Donnajean LopesWendy Pryde to be D/C'd Home per MD order.  Discussed with the patient and all questions fully answered.    Medication List         acetaminophen 325 MG tablet  Commonly known as:  TYLENOL  Take 2 tablets (650 mg total) by mouth every 4 (four) hours as needed for mild pain.     BC HEADACHE PO  Take 1 Package by mouth daily as needed. For pain     MULTI-VITAMIN DAILY PO  Take 1 tablet by mouth daily.     oxyCODONE-acetaminophen 5-325 MG per tablet  Commonly known as:  PERCOCET/ROXICET  Take 1-2 tablets by mouth every 4 (four) hours as needed for moderate pain.        VSS, Skin clean, dry and intact without evidence of skin break down, 3 laparoscopic incision sites clean, dry, intact with skin glue. No evidence of skin tears noted. IV catheter discontinued intact. Site without signs and symptoms of complications. Dressing and pressure applied.  An After Visit Summary was printed and given to the patient.  D/c education completed with patient/family including follow up instructions, medication list, d/c activities limitations if indicated, with other d/c instructions as indicated by MD - patient able to verbalize understanding, all questions fully answered. Prescription for Percocet given to patient and cell phone retrieved from hospital safe and returned to patient.  Patient instructed to return to ED, call 911, or call MD for any changes in condition.   Patient escorted via WC, and D/C home via private auto.  Burt Ekaylor D Cook 01/07/2014 9:18 AM

## 2014-01-07 NOTE — Discharge Summary (Signed)
  Physician Discharge Summary  Patient ID: Veronica Zamora MRN: [id number] DOB/AGE: 45-Oct-1972 [age] y.o.  Admit date: 01/06/2014 Discharge date: 01/07/2014  Admitting Diagnosis: Acute appendicitis  Discharge Diagnosis Patient Active Problem List   Diagnosis Date Noted  . Acute appendicitis 01/07/2014    Consultants none  Imaging: Ct Abdomen Pelvis W Contrast  01/06/2014   CLINICAL DATA:  Abdominal pain  EXAM: CT ABDOMEN AND PELVIS WITH CONTRAST  TECHNIQUE: Multidetector CT imaging of the abdomen and pelvis was performed using the standard protocol following bolus administration of intravenous contrast.  CONTRAST:  80mL OMNIPAQUE IOHEXOL 300 MG/ML  SOLN  COMPARISON:  None.  FINDINGS: Benign hemangioma in the left lobe of the liver. Tiny hypodensity in the right lobe on image 26 is nonspecific.  Gallbladder, spleen, pancreas, adrenal glands, and kidneys are within normal limits.  There is nonspecific stranding involving the fat surrounding the celiac.  The appendix is thickened and prominent. There is stranding in the adjacent fat. Findings are compatible with acute appendicitis. No extraluminal bowel gas. No abscess.  Uterus and adnexa are within normal limits. Bladder is distended. Degenerative changes at L5-S1.  IMPRESSION: Acute appendicitis. Critical Value/emergent results were called by telephone at the time of interpretation on 01/06/2014 at 8:38 PM to Dr. Mellody DrownLAUREN PARKER , who verbally acknowledged these results.  Nonspecific stranding in the fat surrounding the celiac axis. An inflammatory process is not excluded.   Electronically Signed   By: Maryclare BeanArt  Hoss M.D.   On: 01/06/2014 20:38    Procedures Laparoscopic appendectomy---Dr. Andrey CampanileWilson 01/06/14  Hospital Course:  [facility].  Workup showed acute appendicitis.  Patient was admitted and underwent procedure listed above.  Tolerated procedure well and was transferred to the floor.  Diet was advanced as  tolerated.  On POD#1 the patient was voiding well, tolerating diet, ambulating well, pain well controlled, vital signs stable, incisions c/d/i and felt stable for discharge home.  Medication side effects discussed, instructions given. She works at [employer], [employer] and unable to accommodate work restriction, return to work in 2 weeks for this reason.  Patient will follow up in our office in 3 weeks and knows to call with questions or concerns.    Physical Exam: General:  Alert, NAD, pleasant, comfortable Abd:  Soft, ND, mild tenderness, incisions C/D/I    Medication List         acetaminophen 325 MG tablet  Commonly known as:  TYLENOL  Take 2 tablets (650 mg total) by mouth every 4 (four) hours as needed for mild pain.     BC HEADACHE PO  Take 1 Package by mouth daily as needed. For pain     MULTI-VITAMIN DAILY PO  Take 1 tablet by mouth daily.     oxyCODONE-acetaminophen 5-325 MG per tablet  Commonly known as:  PERCOCET/ROXICET  Take 1-2 tablets by mouth every 4 (four) hours as needed for moderate pain.             Follow-up Information   Follow up with [facility] On 02/01/2014. (arrive by 1PM for a 1:30PM appt)    Contact information:   [address]   [place] [address] [phone]       Signed: Ashok Norrismina Brihanna Devenport, [facility] [phone]  01/07/2014, 8:19 AM

## 2014-01-07 NOTE — Op Note (Signed)
Veronica LopesWendy Zamora [id number] 1971/06/19 01/07/2014  Appendectomy, Lap, Procedure Note  Indications: The patient presented with a history of right-sided abdominal pain. A CT revealed findings consistent with acute appendicitis.  Pre-operative Diagnosis: Acute appendicitis without mention of peritonitis  Post-operative Diagnosis: Same  Surgeon: Atilano InaEric M Deshawnda Acrey   Assistants: none  Anesthesia: General endotracheal anesthesia  ASA Class: 3E  Procedure Details  The patient was seen again in the Holding Room. The risks, benefits, complications, treatment options, and expected outcomes were discussed with the patient and/or family. The possibilities of perforation of viscus, bleeding, recurrent infection, the need for additional procedures, failure to diagnose a condition, and creating a complication requiring transfusion or operation were discussed. There was concurrence with the proposed plan and informed consent was obtained. The site of surgery was properly noted. The patient was taken to Operating Room, identified as Veronica LopesWendy Zamora and the procedure verified as Appendectomy. A Time Out was held and the above information confirmed.  The patient was placed in the supine position and general anesthesia was induced, along with placement of orogastric tube, SCDs, and a Foley catheter. The abdomen was prepped and draped in a sterile fashion. A 1.5 centimeter infraumbilical incision was made.  The umbilical stalk was elevated, and the midline fascia was incised with a #11 blade.  A Kelly clamp was used to confirm entrance into the peritoneal cavity.  A pursestring suture was passed around the incision with a 0 Vicryl.  A 12mm Hasson was introduced into the abdomen and the tails of the suture were used to hold the Hasson in place.   The pneumoperitoneum was then established to steady pressure of 15 mmHg.  Additional 5 mm cannulas then placed in the left lower quadrant of the abdomen and the suprapubic region  under direct visualization. A careful evaluation of the entire abdomen was carried out. The patient was placed in Trendelenburg and left lateral decubitus position. The small intestines were retracted in the cephalad and left lateral direction away from the pelvis and right lower quadrant. The patient was found to have an inflammed appendix that was extending into the pelvis. There was no evidence of perforation.  The appendix was carefully dissected. The appendix was was skeletonized with the harmonic scalpel.   The appendix was divided at its base using an endo-GIA stapler with a white load. No appendiceal stump was left in place. The appendix was removed from the abdomen with an Endocatch bag through the umbilical port.  There was no evidence of bleeding, leakage, or complication after division of the appendix. Irrigation was also performed and irrigate suctioned from the abdomen as well.  The umbilical port site was closed with the purse string suture. There was an air leak so 3 additional interrupted 0-vicryl sutures were placed.  The closure was viewed laparoscopically. There was no residual palpable fascial defect.  The trocar site skin wounds were closed with 4-0 Monocryl. Dermabond was applied to the skin incisions.  Instrument, sponge, and needle counts were correct at the conclusion of the case.   Findings: The appendix was found to be inflamed. There were not signs of necrosis.  There was not perforation. There was not abscess formation.  Estimated Blood Loss:  Minimal         Drains: none         Specimens: appendix         Complications:  None; patient tolerated the procedure well.         Disposition: PACU -  hemodynamically stable.         Condition: stable  Leighton Ruff. Redmond Pulling, MD, FACS General, Bariatric, & Minimally Invasive Surgery [facility], [place]

## 2014-01-07 NOTE — Discharge Instructions (Signed)

## 2014-01-07 NOTE — Anesthesia Postprocedure Evaluation (Signed)
  Anesthesia Post-op Note  Patient: Veronica Zamora  Procedure(s) Performed: Procedure(s): APPENDECTOMY LAPAROSCOPIC (N/A)  Patient Location: PACU  Anesthesia Type:General  Level of Consciousness: awake, alert , oriented and patient cooperative  Airway and Oxygen Therapy: Patient Spontanous Breathing  Post-op Pain: mild  Post-op Assessment: Post-op Vital signs reviewed, Patient's Cardiovascular Status Stable, Respiratory Function Stable, Patent Airway, No signs of Nausea or vomiting and Pain level controlled  Post-op Vital Signs: stable  Last Vitals:  Filed Vitals:   01/07/14 0115  BP: 134/72  Pulse: 78  Temp: 36.4 C  Resp: 16    Complications: No apparent anesthesia complications

## 2014-01-09 NOTE — ED Provider Notes (Signed)
Medical screening examination/treatment/procedure(s) were performed by non-physician practitioner and as supervising physician I was immediately available for consultation/collaboration.   EKG Interpretation   Date/Time:  Thursday January 06 2014 21:35:39 EDT Ventricular Rate:  65 PR Interval:  163 QRS Duration: 88 QT Interval:  404 QTC Calculation: 420 R Axis:   5 Text Interpretation:  Sinus rhythm ED PHYSICIAN INTERPRETATION AVAILABLE  IN [facility] Confirmed by TEST, Record ([id number]) on 01/08/2014 12:13:02  PM        Richardean Canalavid H Jansel Vonstein, MD 01/09/14 1259

## 2014-01-11 ENCOUNTER — Encounter (HOSPITAL_COMMUNITY): Payer: Self-pay | Admitting: General Surgery

## 2014-01-17 ENCOUNTER — Telehealth (INDEPENDENT_AMBULATORY_CARE_PROVIDER_SITE_OTHER): Payer: Self-pay | Admitting: General Surgery

## 2014-01-17 NOTE — Telephone Encounter (Signed)
Message copied by Wilder GladeMCPHERSON, Megin Consalvo on Mon Jan 17, 2014  3:37 PM ------      Message from: Kerrin ChampagneLOWERY, TENA M      Created: Mon Jan 17, 2014  3:32 PM      Contact: [phone]       Pt has an appt on 05/19 can she return to work before her appt time. Please call her ------

## 2014-01-17 NOTE — Telephone Encounter (Signed)
Patient called and I told her that she can not lift, pull, push over 20 lbs until she come in to see DOW on 02-01-14

## 2014-01-17 NOTE — Telephone Encounter (Signed)
LMOM for patient to call back and ask for Veronica Zamora 

## 2014-02-01 ENCOUNTER — Encounter (INDEPENDENT_AMBULATORY_CARE_PROVIDER_SITE_OTHER): Payer: Self-pay

## 2014-02-01 ENCOUNTER — Encounter (INDEPENDENT_AMBULATORY_CARE_PROVIDER_SITE_OTHER): Payer: Self-pay | Admitting: *Deleted

## 2014-02-01 ENCOUNTER — Ambulatory Visit (INDEPENDENT_AMBULATORY_CARE_PROVIDER_SITE_OTHER): Payer: BC Managed Care – PPO | Admitting: General Surgery

## 2014-02-01 VITALS — BP 126/72 | HR 78 | Temp 99.2°F | Resp 18 | Ht 61.0 in | Wt 149.6 lb

## 2014-02-01 DIAGNOSIS — K3580 Unspecified acute appendicitis: Secondary | ICD-10-CM

## 2014-02-01 NOTE — Patient Instructions (Signed)
You can go back to work on 02/05/14.  Call if you have any issues.

## 2014-02-01 NOTE — Progress Notes (Signed)
Veronica Zamora 1971/08/16 [id number] 02/01/2014   History of Present Illness: Veronica Zamora is a  43 y.o. female who presents today status post lap appy by Dr. Gaynelle AduEric Wilson.   Pathology reveals:   Appendix, Other than Incidental - FIBROUS OBLITERATION OF APPENDIX ASSOCIATED WITH ACUTE SUPPURATIVE APPENDICITIS..   The patient is tolerating a regular diet, having normal bowel movements, has good pain control.  She  is back to most normal activities.   Physical Exam:  BP 126/72  Pulse 78  Temp(Src) 99.2 F (37.3 C)  Resp 18  Ht 5\' 1"  (1.549 m)  Wt 67.858 kg (149 lb 9.6 oz)  BMI 28.28 kg/m2  LMP 12/30/2013  Abd: soft, nontender, active bowel sounds, nondistended.  All incisions are well healed.  Impression: 1.  Acute appendicitis, s/p lap appy  Plan: She  is able to return to normal activities. She may follow up on a prn basis.

## 2015-01-18 ENCOUNTER — Encounter (HOSPITAL_COMMUNITY): Payer: Self-pay | Admitting: *Deleted

## 2015-01-18 ENCOUNTER — Emergency Department (HOSPITAL_COMMUNITY)
Admission: EM | Admit: 2015-01-18 | Discharge: 2015-01-19 | Disposition: A | Discharge status: Home / Self Care | Payer: Medicaid Other | Attending: Emergency Medicine | Admitting: Emergency Medicine

## 2015-01-18 DIAGNOSIS — Z720 Tobacco use: Secondary | ICD-10-CM | POA: Insufficient documentation

## 2015-01-18 DIAGNOSIS — R221 Localized swelling, mass and lump, neck: Secondary | ICD-10-CM

## 2015-01-18 DIAGNOSIS — Z8639 Personal history of other endocrine, nutritional and metabolic disease: Secondary | ICD-10-CM | POA: Insufficient documentation

## 2015-01-18 DIAGNOSIS — Z79899 Other long term (current) drug therapy: Secondary | ICD-10-CM | POA: Insufficient documentation

## 2015-01-18 DIAGNOSIS — M5080 Other cervical disc disorders, unspecified cervical region: Secondary | ICD-10-CM | POA: Insufficient documentation

## 2015-01-18 DIAGNOSIS — I10 Essential (primary) hypertension: Secondary | ICD-10-CM | POA: Insufficient documentation

## 2015-01-18 DIAGNOSIS — R634 Abnormal weight loss: Secondary | ICD-10-CM

## 2015-01-18 DIAGNOSIS — I252 Old myocardial infarction: Secondary | ICD-10-CM | POA: Insufficient documentation

## 2015-01-18 NOTE — ED Notes (Signed)
The pt has had some difficulty swallowing for the past 3 months and for the last   Week or so she has felt some nodules in her throat lt and rt  lmp one week ago

## 2015-01-18 NOTE — ED Notes (Signed)
Pt reports being told in the past by her PCP that her thyroid level was "off". Pt reports losing approx. 15 lbs in the last 3 months.

## 2015-01-19 ENCOUNTER — Emergency Department (HOSPITAL_COMMUNITY): Payer: Medicaid Other

## 2015-01-19 ENCOUNTER — Emergency Department (HOSPITAL_COMMUNITY): Payer: Self-pay

## 2015-01-19 LAB — T4, FREE: Free T4: 0.58 ng/dL — ABNORMAL LOW (ref 0.61–1.12)

## 2015-01-19 LAB — BASIC METABOLIC PANEL
ANION GAP: 10 (ref 5–15)
BUN: 9 mg/dL (ref 6–20)
CO2: 23 mmol/L (ref 22–32)
CREATININE: 0.62 mg/dL (ref 0.44–1.00)
Calcium: 8.9 mg/dL (ref 8.9–10.3)
Chloride: 106 mmol/L (ref 101–111)
GFR calc Af Amer: >60 mL/min (ref >60)
GFR calc non Af Amer: >60 mL/min (ref >60)
GLUCOSE: 108 mg/dL — AB (ref 70–99)
Potassium: 3.8 mmol/L (ref 3.5–5.1)
Sodium: 139 mmol/L (ref 135–145)

## 2015-01-19 LAB — CBC WITH DIFFERENTIAL/PLATELET
Basophils Absolute: 0 10*3/uL (ref 0.0–0.1)
Basophils Relative: 0 % (ref 0–1)
Eosinophils Absolute: 0.1 10*3/uL (ref 0.0–0.7)
Eosinophils Relative: 1 % (ref 0–5)
HCT: 32.3 % — ABNORMAL LOW (ref 36.0–46.0)
Hemoglobin: 9.4 g/dL — ABNORMAL LOW (ref 12.0–15.0)
LYMPHS ABS: 1.4 10*3/uL (ref 0.7–4.0)
LYMPHS PCT: 25 % (ref 12–46)
MCH: 22.8 pg — ABNORMAL LOW (ref 26.0–34.0)
MCHC: 29.1 g/dL — ABNORMAL LOW (ref 30.0–36.0)
MCV: 78.2 fL (ref 78.0–100.0)
Monocytes Absolute: 0.4 10*3/uL (ref 0.1–1.0)
Monocytes Relative: 7 % (ref 3–12)
Neutro Abs: 3.8 10*3/uL (ref 1.7–7.7)
Neutrophils Relative %: 67 % (ref 43–77)
PLATELETS: 210 10*3/uL (ref 150–400)
RBC: 4.13 MIL/uL (ref 3.87–5.11)
RDW: 20.9 % — AB (ref 11.5–15.5)
WBC: 5.6 10*3/uL (ref 4.0–10.5)

## 2015-01-19 LAB — TSH: TSH: 4.257 u[IU]/mL (ref 0.350–4.500)

## 2015-01-19 MED ORDER — CEPHALEXIN 500 MG PO CAPS: 500.0000 mg | ORAL_CAPSULE | Freq: Four times a day (QID) | ORAL | Status: AC

## 2015-01-19 MED ORDER — IOHEXOL 300 MG/ML  SOLN
80.0000 mL | Freq: Once | INTRAMUSCULAR | Status: AC | PRN
Start: 2015-01-19 — End: 2015-01-19
  Administered 2015-01-19: 100 mL via INTRAVENOUS

## 2015-01-19 NOTE — Discharge Instructions (Signed)
CALL THE [facility] AND LET THEM KNOW YOUR WERE REFERRED BY THE EMERGENCY DEPARTMENT FOR FURTHER EVALUATION OF SYMPTOMS OF NECK FULLNESS AND ABNORMAL CT SCAN OF YOUR NECK. IT IS RECOMMENDED THAT YOU HAVE MRI OF YOUR NECK IN ONE WEEK. TAKE KEFLEX AS DIRECTED. RETURN HERE WITH ANY WORSENING SYMPTOMS OR NEW CONCERNS.

## 2015-01-19 NOTE — ED Notes (Signed)
Called CT and asked about the CT.  She advised that she did not have an IV documented until 0148hrs.  She also said the IV is not ideal in the hand and possibly needs one in the forearm.  I advised her I would look for an IV in the forearm and call them.

## 2015-01-27 NOTE — ED Provider Notes (Signed)
CSN: [id number]     Arrival date & time 01/18/15  2153 History   First MD Initiated Contact with Patient 01/18/15 2325     Chief Complaint  Patient presents with  . Dysphagia     (Consider location/radiation/quality/duration/timing/severity/associated sxs/prior Treatment) HPI Comments: She presents for evaluation of a sense of foreign body in the left side neck/throat after eating earlier today.  She has been able to drink without any vomiting or regurgitation. She has been told her thyroid was "off" in the past but is not treated for thyroid disease. She reports a recent large unintentional weight loss. No fever. No significant pain. She denies dental issues. No SOB, cough or chest pain.  The history is provided by the patient. No language interpreter was used.    Past Medical History  Diagnosis Date  . Myocardial infarct   . Hypertension   . Hypercholesteremia    Past Surgical History  Procedure Laterality Date  . Tubal ligation    . Laparoscopic appendectomy N/A 01/06/2014    Procedure: APPENDECTOMY LAPAROSCOPIC;  Surgeon: Atilano InaEric M Wilson, MD;  [facility];  Service: General;  Laterality: N/A;   Family History  Problem Relation Age of Onset  . Diabetes Father   . Heart failure Father   . Hypertension Father    History  Substance Use Topics  . Smoking status: Current Every Day Smoker -- 1.00 packs/day    Types: Cigarettes  . Smokeless tobacco: Not on file  . Alcohol Use: No   OB History    No data available     Review of Systems  Constitutional: Positive for unexpected weight change. Negative for fever and chills.  HENT: Positive for trouble swallowing.        See HPI.  Respiratory: Negative.  Negative for cough and shortness of breath.   Cardiovascular: Negative.   Gastrointestinal: Negative.   Musculoskeletal: Negative.   Skin: Negative.   Neurological: Negative.  Negative for dizziness, facial asymmetry and weakness.      Allergies  Review of patient's  allergies indicates no known allergies.  Home Medications   Prior to Admission medications   Medication Sig Start Date End Date Taking? Authorizing Provider  Aspirin-Salicylamide-Caffeine (BC HEADACHE PO) Take 1 Package by mouth daily as needed. For pain   Yes Historical Provider, MD  IRON PO Take 1 tablet by mouth daily.   Yes Historical Provider, MD  Multiple Vitamin (MULTI-VITAMIN DAILY PO) Take 1 tablet by mouth daily.   Yes Historical Provider, MD  acetaminophen (TYLENOL) 325 MG tablet Take 2 tablets (650 mg total) by mouth every 4 (four) hours as needed for mild pain. Patient not taking: Reported on 01/19/2015 01/07/14   Ashok NorrisEmina Riebock, NP  cephALEXin (KEFLEX) 500 MG capsule Take 1 capsule (500 mg total) by mouth 4 (four) times daily. 01/19/15   Elpidio AnisShari Jaray Boliver, PA-C  oxyCODONE-acetaminophen (PERCOCET/ROXICET) 5-325 MG per tablet Take 1-2 tablets by mouth every 4 (four) hours as needed for moderate pain. Patient not taking: Reported on 01/19/2015 01/07/14   Emina Riebock, NP   BP 119/78 mmHg  Pulse 80  Temp(Src) 97.7 F (36.5 C) (Oral)  Resp 18  Wt 139 lb (63.05 kg)  SpO2 98%  LMP 01/10/2015 Physical Exam  Constitutional: She is oriented to person, place, and time. She appears well-developed and well-nourished.  HENT:  Head: Normocephalic.  Mouth/Throat: Oropharynx is clear and moist. No oropharyngeal exudate.  Neck: Normal range of motion. Neck supple. No tracheal deviation present. No thyromegaly present.  No palpable left sided mass.  Cardiovascular: Normal rate and regular rhythm.   Pulmonary/Chest: Effort normal and breath sounds normal.  Abdominal: Soft. Bowel sounds are normal. There is no tenderness. There is no rebound and no guarding.  Musculoskeletal: Normal range of motion.  Lymphadenopathy:    She has no cervical adenopathy.  Neurological: She is alert and oriented to person, place, and time.  Skin: Skin is warm and dry. No rash noted.  Psychiatric: She has a normal mood  and affect.    ED Course  Procedures (including critical care time) Labs Review Labs Reviewed  CBC WITH DIFFERENTIAL/PLATELET - Abnormal; Notable for the following:    Hemoglobin 9.4 (*)    HCT 32.3 (*)    MCH 22.8 (*)    MCHC 29.1 (*)    RDW 20.9 (*)    All other components within normal limits  BASIC METABOLIC PANEL - Abnormal; Notable for the following:    Glucose, Bld 108 (*)    All other components within normal limits  T4, FREE - Abnormal; Notable for the following:    Free T4 0.58 (*)    All other components within normal limits  TSH   Results for orders placed or performed during the hospital encounter of 01/18/15  CBC with Differential  Result Value Ref Range   WBC 5.6 4.0 - 10.5 K/uL   RBC 4.13 3.87 - 5.11 MIL/uL   Hemoglobin 9.4 (L) 12.0 - 15.0 g/dL   HCT 38.732.3 (L) 56.436.0 - 33.246.0 %   MCV 78.2 78.0 - 100.0 fL   MCH 22.8 (L) 26.0 - 34.0 pg   MCHC 29.1 (L) 30.0 - 36.0 g/dL   RDW 95.120.9 (H) 88.411.5 - 16.615.5 %   Platelets 210 150 - 400 K/uL   Neutrophils Relative % 67 43 - 77 %   Neutro Abs 3.8 1.7 - 7.7 K/uL   Lymphocytes Relative 25 12 - 46 %   Lymphs Abs 1.4 0.7 - 4.0 K/uL   Monocytes Relative 7 3 - 12 %   Monocytes Absolute 0.4 0.1 - 1.0 K/uL   Eosinophils Relative 1 0 - 5 %   Eosinophils Absolute 0.1 0.0 - 0.7 K/uL   Basophils Relative 0 0 - 1 %   Basophils Absolute 0.0 0.0 - 0.1 K/uL  Basic metabolic panel  Result Value Ref Range   Sodium 139 135 - 145 mmol/L   Potassium 3.8 3.5 - 5.1 mmol/L   Chloride 106 101 - 111 mmol/L   CO2 23 22 - 32 mmol/L   Glucose, Bld 108 (H) 70 - 99 mg/dL   BUN 9 6 - 20 mg/dL   Creatinine, Ser 0.630.62 0.44 - 1.00 mg/dL   Calcium 8.9 8.9 - 01.610.3 mg/dL   GFR calc non Af Amer >60 >60 mL/min   GFR calc Af Amer >60 >60 mL/min   Anion gap 10 5 - 15  TSH  Result Value Ref Range   TSH 4.257 0.350 - 4.500 uIU/mL  T4, free  Result Value Ref Range   Free T4 0.58 (L) 0.61 - 1.12 ng/dL   Dg Chest 2 View  0/1/09325/01/2015   CLINICAL DATA:  Increase  eating difficulty swallowing solid over 3 months.  EXAM: CHEST  2 VIEW  COMPARISON:  Radiograph 08/23/2013  FINDINGS: Normal mediastinum and cardiac silhouette. Normal pulmonary vasculature. No evidence of effusion, infiltrate, or pneumothorax. No acute bony abnormality.  IMPRESSION: No acute cardiopulmonary process.   Electronically Signed   By: Roseanne RenoStewart  Amil Amen M.D.   On: 01/19/2015 01:45   Ct Soft Tissue Neck W Contrast  01/19/2015   CLINICAL DATA:  Slowly enlarging LEFT neck mass, cyst dysphagia.  EXAM: CT NECK WITH CONTRAST  TECHNIQUE: Multidetector CT imaging of the neck was performed using the standard protocol following the bolus administration of intravenous contrast.  CONTRAST:  OMNIPAQUE IOHEXOL 300 MG/ML  SOLN  COMPARISON:  None.  FINDINGS: Pharynx and larynx: Normal.  Salivary glands: Normal.  Thyroid: Normal.  Lymph nodes: No lymphadenopathy.  Small intra parotid lymph nodes.  Vascular: Normal.  Limited intracranial: Normal.  Visualized orbits: Normal.  Mastoids and visualized paranasal sinuses: Well aerated.  Skeleton: Moderate C7-T1 facet arthropathy without acute fracture, malalignment of the cervical spine. No destructive bony lesions.  Upper chest: Ill-defined soft tissue within the LEFT supraclavicular fossa, interposed between the internal jugular vein and scalene muscles. No free fluid or fluid collections.  IMPRESSION: Ill-defined LEFT supraclavicular soft tissue, is unclear if these reflect lymph nodes, recent trauma or possible infectious process. Recommend followup MRI of the neck in 1 week with contrast.  Patent airway.  Acute findings discussed with and reconfirmed by Dr.Destina Mantei on 01/19/2015 at 3:40 am.   Electronically Signed   By: Awilda Metro   On: 01/19/2015 03:41     Imaging Review No results found.   EKG Interpretation None      MDM   Final diagnoses:  Neck fullness  Weight loss    The patient remains stable in ED. Drinking fluids without  difficulty in swallowing. Discussed abnormal CT scan and recommended follow up and provided referral to [facility]. Thyroid studies pending for follow up review as well.     Elpidio Anis, PA-C 01/27/15 2054  Cy Blamer, MD 01/27/15 [phone]

## 2015-07-05 ENCOUNTER — Encounter (HOSPITAL_COMMUNITY): Payer: Self-pay | Admitting: *Deleted

## 2015-07-05 ENCOUNTER — Emergency Department (HOSPITAL_COMMUNITY)
Admission: EM | Admit: 2015-07-05 | Discharge: 2015-07-05 | Disposition: A | Discharge status: Home / Self Care | Payer: BLUE CROSS/BLUE SHIELD | Attending: Emergency Medicine | Admitting: Emergency Medicine

## 2015-07-05 DIAGNOSIS — Z7982 Long term (current) use of aspirin: Secondary | ICD-10-CM | POA: Diagnosis not present

## 2015-07-05 DIAGNOSIS — H109 Unspecified conjunctivitis: Secondary | ICD-10-CM | POA: Insufficient documentation

## 2015-07-05 DIAGNOSIS — Z720 Tobacco use: Secondary | ICD-10-CM | POA: Diagnosis not present

## 2015-07-05 DIAGNOSIS — H5712 Ocular pain, left eye: Secondary | ICD-10-CM | POA: Diagnosis present

## 2015-07-05 DIAGNOSIS — Z8639 Personal history of other endocrine, nutritional and metabolic disease: Secondary | ICD-10-CM | POA: Diagnosis not present

## 2015-07-05 DIAGNOSIS — I252 Old myocardial infarction: Secondary | ICD-10-CM | POA: Diagnosis not present

## 2015-07-05 MED ORDER — FLUORESCEIN SODIUM 1 MG OP STRP
1.0000 | ORAL_STRIP | Freq: Once | OPHTHALMIC | Status: AC
  Administered 2015-07-05: 1 via OPHTHALMIC
  Filled 2015-07-05: qty 1

## 2015-07-05 MED ORDER — TOBRAMYCIN 0.3 % OP SOLN: 2.0000 [drp] | OPHTHALMIC | Status: AC

## 2015-07-05 NOTE — ED Notes (Signed)
Patient is alert and orientedx4.  Patient was explained discharge instructions and they understood them with no questions.   

## 2015-07-05 NOTE — ED Provider Notes (Signed)
CSN: [id number]     Arrival date & time 07/05/15  2234 History  By signing my name below, I, Lyndel SafeKaitlyn Shelton, attest that this documentation has been prepared under the direction and in the presence of Ok EdwardsLeslie Karen Lyniah Fujita, [place] JerseyPA-C. Electronically Signed: Lyndel SafeKaitlyn Shelton, ED Scribe. 07/05/2015. 11:07 PM.  Chief Complaint  Patient presents with  . Eye Pain    The history is provided by the patient. No language interpreter was used.   HPI Comments: Veronica Zamora is a 44 y.o. female, with a h/o a corneal abrasion, who presents to the Emergency Department complaining of constant, worsening left eye irritation with surrounded erythema to lower and upper lid onset 1 day ago. Pt reports she wears contacts and has been painting and cleaning out her house the past 2 days. She removed her contacts today after increased irritation. Additionally she notes photophobia intermittently since onset of the pain. She is not followed regularly by an optometrist or ophthalmologist. Her contacts were fitted and prescribed at a [facility]. Pt has a PMhx of MI and HTN but is not prescribed hypertension medication.   Past Medical History  Diagnosis Date  . Myocardial infarct (HCC)   . Hypertension   . Hypercholesteremia    Past Surgical History  Procedure Laterality Date  . Tubal ligation    . Laparoscopic appendectomy N/A 01/06/2014    Procedure: APPENDECTOMY LAPAROSCOPIC;  Surgeon: Atilano InaEric M Wilson, MD;  [facility];  Service: General;  Laterality: N/A;   Family History  Problem Relation Age of Onset  . Diabetes Father   . Heart failure Father   . Hypertension Father    Social History  Substance Use Topics  . Smoking status: Current Every Day Smoker -- 1.00 packs/day    Types: Cigarettes  . Smokeless tobacco: None  . Alcohol Use: No   OB History    No data available     Review of Systems  Constitutional: Negative for fever.  Eyes: Positive for pain and redness. Negative for photophobia and  discharge.  All other systems reviewed and are negative.  Allergies  Review of patient's allergies indicates no known allergies.  Home Medications   Prior to Admission medications   Medication Sig Start Date End Date Taking? Authorizing Provider  acetaminophen (TYLENOL) 325 MG tablet Take 2 tablets (650 mg total) by mouth every 4 (four) hours as needed for mild pain. Patient not taking: Reported on 01/19/2015 01/07/14   Ashok NorrisEmina Riebock, NP  Aspirin-Salicylamide-Caffeine (BC HEADACHE PO) Take 1 Package by mouth daily as needed. For pain    Historical Provider, MD  cephALEXin (KEFLEX) 500 MG capsule Take 1 capsule (500 mg total) by mouth 4 (four) times daily. 01/19/15   Elpidio AnisShari Upstill, PA-C  IRON PO Take 1 tablet by mouth daily.    Historical Provider, MD  Multiple Vitamin (MULTI-VITAMIN DAILY PO) Take 1 tablet by mouth daily.    Historical Provider, MD  oxyCODONE-acetaminophen (PERCOCET/ROXICET) 5-325 MG per tablet Take 1-2 tablets by mouth every 4 (four) hours as needed for moderate pain. Patient not taking: Reported on 01/19/2015 01/07/14   Emina Riebock, NP   BP 137/71 mmHg  Pulse 87  Temp(Src) 98 F (36.7 C) (Oral)  Resp 16  Ht 5\' 1"  (1.549 m)  Wt 145 lb (65.772 kg)  BMI 27.41 kg/m2  SpO2 98%  LMP 07/05/2015 Physical Exam  Constitutional: She is oriented to person, place, and time. She appears well-developed and well-nourished. No distress.  HENT:  Head: Normocephalic.  Eyes:  Conjunctivae are normal.  Neck: Normal range of motion. Neck supple.  Cardiovascular: Normal rate.   Pulmonary/Chest: Effort normal. No respiratory distress.  Musculoskeletal: Normal range of motion.  Neurological: She is alert and oriented to person, place, and time. Coordination normal.  Skin: Skin is warm.  Psychiatric: She has a normal mood and affect. Her behavior is normal.  Nursing note and vitals reviewed.   ED Course  Procedures  DIAGNOSTIC STUDIES: Oxygen Saturation is 98% on RA, normal by my  interpretation.    COORDINATION OF CARE: 11:06 PM Discussed treatment plan with pt at bedside and pt agreed to plan.  MDM   Final diagnoses:  Conjunctivitis of left eye    tobrex Pt advised to see opthomology for evaluation if symptoms persist.   Elson Areas, PA-C 07/05/15 2354  Lonia Skinner Gaylord, PA-C 07/12/15 1456  Gwyneth Sprout, MD 07/14/15 [phone]

## 2015-07-05 NOTE — Discharge Instructions (Signed)

## 2015-07-05 NOTE — ED Notes (Signed)
Pt reports eye irritation and pain that began yesterday, pt's eye is red denies any drainage. Pt admits to hx of corneal abrasion and recently began wearing contact approx 5 months ago.

## 2015-10-05 ENCOUNTER — Emergency Department (HOSPITAL_COMMUNITY): Payer: BLUE CROSS/BLUE SHIELD

## 2015-10-05 ENCOUNTER — Encounter (HOSPITAL_COMMUNITY): Payer: Self-pay | Admitting: *Deleted

## 2015-10-05 DIAGNOSIS — I10 Essential (primary) hypertension: Secondary | ICD-10-CM | POA: Diagnosis not present

## 2015-10-05 DIAGNOSIS — R079 Chest pain, unspecified: Secondary | ICD-10-CM | POA: Diagnosis not present

## 2015-10-05 DIAGNOSIS — K0889 Other specified disorders of teeth and supporting structures: Secondary | ICD-10-CM | POA: Diagnosis present

## 2015-10-05 DIAGNOSIS — F17210 Nicotine dependence, cigarettes, uncomplicated: Secondary | ICD-10-CM | POA: Insufficient documentation

## 2015-10-05 LAB — CBC
HEMATOCRIT: 33.4 % — AB (ref 36.0–46.0)
HEMOGLOBIN: 9.3 g/dL — AB (ref 12.0–15.0)
MCH: 21.8 pg — ABNORMAL LOW (ref 26.0–34.0)
MCHC: 27.8 g/dL — ABNORMAL LOW (ref 30.0–36.0)
MCV: 78.2 fL (ref 78.0–100.0)
Platelets: 222 10*3/uL (ref 150–400)
RBC: 4.27 MIL/uL (ref 3.87–5.11)
RDW: 20.5 % — ABNORMAL HIGH (ref 11.5–15.5)
WBC: 9.2 10*3/uL (ref 4.0–10.5)

## 2015-10-05 LAB — BASIC METABOLIC PANEL
Anion gap: 10 (ref 5–15)
BUN: 11 mg/dL (ref 6–20)
CO2: 23 mmol/L (ref 22–32)
Calcium: 8.8 mg/dL — ABNORMAL LOW (ref 8.9–10.3)
Chloride: 107 mmol/L (ref 101–111)
Creatinine, Ser: 0.6 mg/dL (ref 0.44–1.00)
GFR calc Af Amer: >60 mL/min (ref >60)
GLUCOSE: 85 mg/dL (ref 65–99)
POTASSIUM: 3.8 mmol/L (ref 3.5–5.1)
Sodium: 140 mmol/L (ref 135–145)

## 2015-10-05 LAB — I-STAT TROPONIN, ED: TROPONIN I, POC: 0 ng/mL (ref 0.00–0.08)

## 2015-10-05 NOTE — ED Notes (Signed)
Pt c/o dental pain x 3 days, chest pain starting today. States that she has been taking goody powders and not eating so she thinks that's why shes having chest pain.

## 2015-10-06 ENCOUNTER — Emergency Department (HOSPITAL_COMMUNITY)
Admission: EM | Admit: 2015-10-06 | Discharge: 2015-10-06 | Discharge status: Against Medical Advice | Payer: BLUE CROSS/BLUE SHIELD | Attending: Emergency Medicine | Admitting: Emergency Medicine

## 2015-10-06 DIAGNOSIS — K0889 Other specified disorders of teeth and supporting structures: Secondary | ICD-10-CM

## 2015-10-06 NOTE — ED Notes (Signed)
No answer when called to be taken to a room. 

## 2015-10-06 NOTE — ED Provider Notes (Signed)
I did not see or evaluate the patient.  I have signed up for the patient but she left after triage.  Azalia Bilis, MD 10/06/15 [phone]

## 2015-10-06 NOTE — ED Notes (Signed)
Unable to locate

## 2018-05-08 ENCOUNTER — Encounter (HOSPITAL_COMMUNITY): Payer: Self-pay | Admitting: Emergency Medicine

## 2018-05-08 ENCOUNTER — Emergency Department (HOSPITAL_COMMUNITY)
Admission: EM | Admit: 2018-05-08 | Discharge: 2018-05-08 | Disposition: A | Discharge status: Home / Self Care | Payer: BLUE CROSS/BLUE SHIELD | Attending: Emergency Medicine | Admitting: Emergency Medicine

## 2018-05-08 DIAGNOSIS — K0889 Other specified disorders of teeth and supporting structures: Secondary | ICD-10-CM

## 2018-05-08 DIAGNOSIS — I10 Essential (primary) hypertension: Secondary | ICD-10-CM | POA: Insufficient documentation

## 2018-05-08 DIAGNOSIS — K047 Periapical abscess without sinus: Secondary | ICD-10-CM | POA: Insufficient documentation

## 2018-05-08 DIAGNOSIS — I252 Old myocardial infarction: Secondary | ICD-10-CM | POA: Insufficient documentation

## 2018-05-08 DIAGNOSIS — Z79899 Other long term (current) drug therapy: Secondary | ICD-10-CM | POA: Insufficient documentation

## 2018-05-08 DIAGNOSIS — Z87891 Personal history of nicotine dependence: Secondary | ICD-10-CM | POA: Insufficient documentation

## 2018-05-08 MED ORDER — AMOXICILLIN 500 MG PO CAPS: 500.0000 mg | ORAL_CAPSULE | Freq: Two times a day (BID) | ORAL | 0 refills | Status: AC

## 2018-05-08 MED ORDER — AMOXICILLIN-POT CLAVULANATE 875-125 MG PO TABS: 1.0000 | ORAL_TABLET | Freq: Two times a day (BID) | ORAL | 0 refills | Status: DC

## 2018-05-08 NOTE — ED Triage Notes (Signed)
Patient to ED c/o L sided dental pain - reports two bad teeth on that side and is to see her dentist either Monday or Tuesday but woke up with swelling to L side of face this morning. Reports trouble chewing and severe pain to that side. Talking in clear, full sentences. Denies fevers or chills.

## 2018-05-08 NOTE — Discharge Instructions (Signed)
Take antibiotics as directed. Please take all of your antibiotics until finished.  You can take Tylenol or Ibuprofen as directed for pain. You can alternate Tylenol and Ibuprofen every 4 hours. If you take Tylenol at 1pm, then you can take Ibuprofen at 5pm. Then you can take Tylenol again at 9pm.   The exam and treatment you received today has been provided on an emergency basis only. This is not a substitute for complete medical or dental care. If your problem worsens or new symptoms (problems) appear, and you are unable to arrange prompt follow-up care with your dentist, call or return to this location. If you do not have a dentist, please follow-up with one on the list provided  CALL YOUR DENTIST OR RETURN IMMEDIATELY IF you develop a fever, rash, difficulty breathing or swallowing, neck or facial swelling, or other potentially serious concerns.   Please follow-up with one of the dental clinics provided to you below or in your paperwork. Call and tell them you were seen in the Emergency Dept and arrange for an appointment. You may have to call multiple places in order to find a place to be seen.  Dental Assistance If the dentist on-call cannot see you, please use the resources below:   Patients with Medicaid: [facility] [address], [phone], [phone]  If unable to pay, or uninsured, contact [facility] ([phone]) or [facility] ([phone] in [place], [phone] in [place]) to become qualified for the adult dental clinic  Other Low-Cost Community Dental Services: [facility]- [address], [place], [place], [address]    [phone], Ext. [phone]    2nd and 4th Thursday of the month at 6:30am    10 clients each day by appointment, can sometimes see walk-in     patients if someone does not show for an appointment [facility]- [address], [place], [place], [address]    [phone]  [facility]- [address], [place], , [address]    [phone]  [facility]- [phone] [facility]- [phone] [facility]- [phone]  

## 2018-05-08 NOTE — ED Provider Notes (Signed)
[facility] Provider Note   CSN: [id number] Arrival date & time: 05/08/18  1534     History   Chief Complaint Chief Complaint  Patient presents with  . Dental Pain    HPI Veronica Zamora is a 47 y.o. female past medical history of hypertension, hypercholesteremia who presents for evaluation of left-sided dental pain.  Patient reports that is been ongoing for the last few days.  She reports that today, she started having swelling to left side of her face.  Patient reports he has been able to tolerate her secretions.  She reports difficulty chewing secondary to pain.  She reports that she has had decreased p.o. secondary to pain.  She reports she has been taking Goody's powder with minimal relief in symptoms.  States she has an appointment with the dentist in 4 to 5 days but she states she came to the ED because she was concerned about the swelling.  Patient denies any fevers, vomiting, difficulty breathing.  The history is provided by the patient.    Past Medical History:  Diagnosis Date  . Hypercholesteremia   . Hypertension   . Myocardial infarct [facility](HCC)     Patient Active Problem List   Diagnosis Date Noted  . Acute appendicitis 01/07/2014    Past Surgical History:  Procedure Laterality Date  . LAPAROSCOPIC APPENDECTOMY N/A 01/06/2014   Procedure: APPENDECTOMY LAPAROSCOPIC;  Surgeon: Atilano InaEric M Wilson, MD;  [facility];  Service: General;  Laterality: N/A;  . TUBAL LIGATION       OB History   None      Home Medications    Prior to Admission medications   Medication Sig Start Date End Date Taking? Authorizing Provider  acetaminophen (TYLENOL) 325 MG tablet Take 2 tablets (650 mg total) by mouth every 4 (four) hours as needed for mild pain. Patient not taking: Reported on 01/19/2015 01/07/14   Ashok Norrisiebock, Emina, NP  amoxicillin (AMOXIL) 500 MG capsule Take 1 capsule (500 mg total) by mouth 2 (two) times daily for 7 days. 05/08/18  05/15/18  Maxwell CaulLayden, Lindsey A, PA-C  Aspirin-Salicylamide-Caffeine (BC HEADACHE PO) Take 1 Package by mouth daily as needed. For pain    [provider]  cephALEXin (KEFLEX) 500 MG capsule Take 1 capsule (500 mg total) by mouth 4 (four) times daily. 01/19/15   Elpidio AnisUpstill, Shari, PA-C  IRON PO Take 1 tablet by mouth daily.    [provider]  Multiple Vitamin (MULTI-VITAMIN DAILY PO) Take 1 tablet by mouth daily.    [provider]  oxyCODONE-acetaminophen (PERCOCET/ROXICET) 5-325 MG per tablet Take 1-2 tablets by mouth every 4 (four) hours as needed for moderate pain. Patient not taking: Reported on 01/19/2015 01/07/14   Riebock, Anette RiedelEmina, NP  tobramycin (TOBREX) 0.3 % ophthalmic solution Place 2 drops into the left eye every 4 (four) hours. 07/05/15   Elson AreasSofia, Leslie K, PA-C    Family History Family History  Problem Relation Age of Onset  . Diabetes Father   . Heart failure Father   . Hypertension Father     Social History Social History   Tobacco Use  . Smoking status: Former Smoker    Packs/day: 1.00    Types: Cigarettes    Last attempt to quit: 04/15/2018    Years since quitting: 0.0  . Tobacco comment: vapes only now  Substance Use Topics  . Alcohol use: No  . Drug use: No     Allergies   Patient has no  known allergies.   Review of Systems Review of Systems  Constitutional: Negative for fever.  HENT: Positive for dental problem and facial swelling. Negative for trouble swallowing.   Respiratory: Negative for shortness of breath.   Gastrointestinal: Negative for abdominal pain and vomiting.  All other systems reviewed and are negative.    Physical Exam Updated Vital Signs BP (!) 158/92 (BP Location: Right Arm)   Pulse 95   Temp 98.2 F (36.8 C) (Oral)   Resp 16   LMP 03/25/2018 (Approximate) Comment: usually has one every month, but not this past month (August '19) ; has had tubes tied  SpO2 99%   Physical Exam  Constitutional: She appears  well-developed and well-nourished.  HENT:  Head: Normocephalic and atraumatic.  Mouth/Throat: Uvula is midline and oropharynx is clear and moist. No trismus in the jaw. Dental caries present.    Diffuse dental caries throughout.  Airways patent, phonation is intact.  Patient has some surrounding gingival erythema but no evidence of fluctuance.  No obvious identifiable dental abscess.  Eyes: Conjunctivae and EOM are normal. Right eye exhibits no discharge. Left eye exhibits no discharge. No scleral icterus.  Pulmonary/Chest: Effort normal.  Neurological: She is alert.  Skin: Skin is warm and dry.  Psychiatric: She has a normal mood and affect. Her speech is normal and behavior is normal.  Nursing note and vitals reviewed.    ED Treatments / Results  Labs (all labs ordered are listed, but only abnormal results are displayed) Labs Reviewed - No data to display  EKG None  Radiology No results found.  Procedures Procedures (including critical care time)  Medications Ordered in ED Medications - No data to display   Initial Impression / Assessment and Plan / ED Course  I have reviewed the triage vital signs and the nursing notes.  Pertinent labs & imaging results that were available during my care of the patient were reviewed by me and considered in my medical decision making (see chart for details).     47 y.o. F who presents with a few days of dental pain. Patient is afebrile , non-toxic appearing, sitting comfortably on examination table. Vital signs reviewed and stable.  On exam, patient has diffuse dental caries.  She has some gingival erythema but no obvious area of fluctuance or edema that would be concerning for an abscess.  No evidence of abscess requiring immediate incision and drainage. Exam not concerning for Ludwig's angina or pharyngeal abscess. Will treat with amoxicillin.  Patient states she has an appointment with a dentist next week.  Patient instructed to  follow-up with dentist referral provided. Stable for discharge at this time. Strict return precautions discussed. Patient expresses understanding and agreement to plan. Patient had ample opportunity for questions and discussion. All patient's questions were answered with full understanding. Strict return precautions discussed. Patient expresses understanding and agreement to plan.    Final Clinical Impressions(s) / ED Diagnoses   Final diagnoses:  Pain, dental  Dental abscess    ED Discharge Orders         Ordered    amoxicillin-clavulanate (AUGMENTIN) 875-125 MG tablet  Every 12 hours,   Status:  Discontinued     05/08/18 1630    amoxicillin (AMOXIL) 500 MG capsule  2 times daily     05/08/18 1631           Maxwell Caul, PA-C 05/08/18 1644    Vanetta Mulders, MD 05/17/18 1721

## 2018-05-08 NOTE — ED Notes (Signed)
Declined W/C at D/C and was escorted to lobby by RN. 

## 2018-07-05 ENCOUNTER — Emergency Department (HOSPITAL_COMMUNITY): Payer: Self-pay

## 2018-07-05 ENCOUNTER — Encounter (HOSPITAL_COMMUNITY): Payer: Self-pay | Admitting: Emergency Medicine

## 2018-07-05 ENCOUNTER — Emergency Department (HOSPITAL_COMMUNITY)
Admission: EM | Admit: 2018-07-05 | Discharge: 2018-07-05 | Disposition: A | Discharge status: Home / Self Care | Payer: Self-pay | Attending: Emergency Medicine | Admitting: Emergency Medicine

## 2018-07-05 DIAGNOSIS — I252 Old myocardial infarction: Secondary | ICD-10-CM | POA: Insufficient documentation

## 2018-07-05 DIAGNOSIS — M79645 Pain in left finger(s): Secondary | ICD-10-CM | POA: Insufficient documentation

## 2018-07-05 DIAGNOSIS — Z79899 Other long term (current) drug therapy: Secondary | ICD-10-CM | POA: Insufficient documentation

## 2018-07-05 DIAGNOSIS — E7800 Pure hypercholesterolemia, unspecified: Secondary | ICD-10-CM | POA: Insufficient documentation

## 2018-07-05 DIAGNOSIS — I10 Essential (primary) hypertension: Secondary | ICD-10-CM | POA: Insufficient documentation

## 2018-07-05 DIAGNOSIS — Z87891 Personal history of nicotine dependence: Secondary | ICD-10-CM | POA: Insufficient documentation

## 2018-07-05 NOTE — ED Provider Notes (Signed)
[facility] EMERGENCY DEPARTMENT Provider Note   CSN: [id number] Arrival date & time: 07/05/18  4098     History   Chief Complaint Chief Complaint  Patient presents with  . Hand Pain    HPI Veronica Zamora is a 47 y.o. female.  Patient with history of coronary artery disease and previous myocardial infarction presents the emergency department with left index finger pain starting yesterday.  Patient works at [employer].  She denies acute injury.  Patient had pain in her left palm and at the base of her left index finger.  Pain is worse with movement.  No redness, swelling.  No fevers.  No treatments prior to arrival.  She denies numbness or tingling in the hands or fingers.  She denies any pain in her elbow or shoulder. The onset of this condition was acute. The course is constant.      Past Medical History:  Diagnosis Date  . Hypercholesteremia   . Hypertension   . Myocardial infarct [facility])     Patient Active Problem List   Diagnosis Date Noted  . Acute appendicitis 01/07/2014    Past Surgical History:  Procedure Laterality Date  . LAPAROSCOPIC APPENDECTOMY N/A 01/06/2014   Procedure: APPENDECTOMY LAPAROSCOPIC;  Surgeon: Atilano Ina, MD;  Location: [facility] OR;  Service: General;  Laterality: N/A;  . TUBAL LIGATION       OB History   None      Home Medications    Prior to Admission medications   Medication Sig Start Date End Date Taking? Authorizing Provider  acetaminophen (TYLENOL) 325 MG tablet Take 2 tablets (650 mg total) by mouth every 4 (four) hours as needed for mild pain. Patient not taking: Reported on 01/19/2015 01/07/14   Ashok Norris, NP  Aspirin-Salicylamide-Caffeine (BC HEADACHE PO) Take 1 Package by mouth daily as needed. For pain    [provider]  cephALEXin (KEFLEX) 500 MG capsule Take 1 capsule (500 mg total) by mouth 4 (four) times daily. 01/19/15   Elpidio Anis, PA-C  IRON PO Take 1 tablet by mouth  daily.    [provider]  Multiple Vitamin (MULTI-VITAMIN DAILY PO) Take 1 tablet by mouth daily.    [provider]  oxyCODONE-acetaminophen (PERCOCET/ROXICET) 5-325 MG per tablet Take 1-2 tablets by mouth every 4 (four) hours as needed for moderate pain. Patient not taking: Reported on 01/19/2015 01/07/14   Riebock, Anette Riedel, NP  tobramycin (TOBREX) 0.3 % ophthalmic solution Place 2 drops into the left eye every 4 (four) hours. 07/05/15   Elson Areas, PA-C    Family History Family History  Problem Relation Age of Onset  . Diabetes Father   . Heart failure Father   . Hypertension Father     Social History Social History   Tobacco Use  . Smoking status: Former Smoker    Packs/day: 1.00    Types: Cigarettes    Last attempt to quit: 04/15/2018    Years since quitting: 0.2  . Tobacco comment: vapes only now  Substance Use Topics  . Alcohol use: No  . Drug use: No     Allergies   Patient has no known allergies.   Review of Systems Review of Systems  Constitutional: Negative for activity change.  Musculoskeletal: Positive for arthralgias. Negative for back pain, joint swelling and neck pain.  Skin: Negative for wound.  Neurological: Negative for weakness and numbness.     Physical Exam Updated Vital Signs BP (!) 160/90  Pulse 84   Temp 98.5 F (36.9 C) (Oral)   Resp 12   LMP 07/05/2018 (Exact Date)   SpO2 100%   Physical Exam  Constitutional: She appears well-developed and well-nourished.  HENT:  Head: Normocephalic and atraumatic.  Eyes: Pupils are equal, round, and reactive to light.  Neck: Normal range of motion. Neck supple.  Cardiovascular: Normal pulses. Exam reveals no decreased pulses.  Musculoskeletal: She exhibits tenderness. She exhibits no edema.       Hands: Neurological: She is alert. No sensory deficit.  Motor, sensation, and vascular distal to the injury is fully intact.   Skin: Skin is warm and dry.  Psychiatric: She has a  normal mood and affect.  Nursing note and vitals reviewed.    ED Treatments / Results  Labs (all labs ordered are listed, but only abnormal results are displayed) Labs Reviewed - No data to display  EKG None  Radiology No results found.  Procedures Procedures (including critical care time)  Medications Ordered in ED Medications - No data to display   Initial Impression / Assessment and Plan / ED Course  I have reviewed the triage vital signs and the nursing notes.  Pertinent labs & imaging results that were available during my care of the patient were reviewed by me and considered in my medical decision making (see chart for details).     Patient seen and examined.  X-ray pending.  Vital signs reviewed and are as follows: BP (!) 160/90   Pulse 84   Temp 98.5 F (36.9 C) (Oral)   Resp 12   LMP 07/05/2018 (Exact Date)   SpO2 100%   Initial impression is mild flexor tenosynovitis due to overuse.  Will obtain x-ray to rule out bony abnormality.  Anticipate discharge to home with conservative measures including Tylenol, and follow-up.  We discussed risks and benefits of NSAIDs in patient with coronary artery disease.  11:02 AM x-ray reviewed and is negative.  Patient to be discharged home with rice protocol, hand follow-up as needed.  Final Clinical Impressions(s) / ED Diagnoses   Final diagnoses:  Pain of finger of left hand   Patient with left ring finger pain, suspect some inflammation in the flexor tendon due to overuse.  No signs of infectious flexor tenosynovitis.  X-ray is negative.  Orthopedic can follow-up given in case not improving.  I would prefer to avoid NSAIDs at this time given cardiac history, hypertension.  ED Discharge Orders    None       Renne Crigler, Cordelia Poche 07/05/18 1103    Tilden Fossa, MD 07/05/18 [id number]

## 2018-07-05 NOTE — Discharge Instructions (Signed)
Please read and follow all provided instructions.  Your diagnoses today include:  1. Pain of finger of left hand     Tests performed today include:  An x-ray of the affected area - does NOT show any broken bones  Vital signs. See below for your results today.   Medications prescribed:   Tylenol  Take any prescribed medications only as directed.  Home care instructions:   Follow any educational materials contained in this packet  Follow R.I.C.E. Protocol:  R - rest your injury   I  - use ice on injury without applying directly to skin  C - compress injury with bandage or splint  E - elevate the injury as much as possible  Follow-up instructions: Please follow-up with your primary care provider or the provided orthopedic physician (bone specialist) if you continue to have significant pain in 1 week. In this case you may have a more severe injury that requires further care.   Return instructions:   Please return if your fingers are numb or tingling, appear gray or blue, or you have severe pain (also elevate the arm and loosen splint or wrap if you were given one)  Please return to the Emergency Department if you experience worsening symptoms.   Please return if you have any other emergent concerns.  Additional Information:  Your vital signs today were: BP (!) 160/90    Pulse 84    Temp 98.5 F (36.9 C) (Oral)    Resp 12    LMP 07/05/2018 (Exact Date)    SpO2 100%  If your blood pressure (BP) was elevated above 135/85 this visit, please have this repeated by your doctor within one month. --------------

## 2018-07-05 NOTE — ED Notes (Addendum)
Good neuro and , pulse and color to fingers , can wiggle fingers and make fist but ring fing aches, works at [employer] does heavy lifting

## 2018-07-05 NOTE — ED Triage Notes (Signed)
Pt states L hand pain, specifically around L ring finger upon waking this am, denies any known injury or trauma.

## 2019-01-11 ENCOUNTER — Emergency Department (HOSPITAL_COMMUNITY)
Admission: EM | Admit: 2019-01-11 | Discharge: 2019-01-11 | Disposition: A | Discharge status: Home / Self Care | Payer: Self-pay | Attending: Emergency Medicine | Admitting: Emergency Medicine

## 2019-01-11 ENCOUNTER — Other Ambulatory Visit: Payer: Self-pay

## 2019-01-11 DIAGNOSIS — Z79899 Other long term (current) drug therapy: Secondary | ICD-10-CM | POA: Insufficient documentation

## 2019-01-11 DIAGNOSIS — M2550 Pain in unspecified joint: Secondary | ICD-10-CM | POA: Insufficient documentation

## 2019-01-11 DIAGNOSIS — Z87891 Personal history of nicotine dependence: Secondary | ICD-10-CM | POA: Insufficient documentation

## 2019-01-11 DIAGNOSIS — I10 Essential (primary) hypertension: Secondary | ICD-10-CM | POA: Insufficient documentation

## 2019-01-11 NOTE — Discharge Instructions (Signed)
Please follow up with [facility] to establish care; you are likely experiencing arthritis pain. You can take Tylenol Arthritis as needed for your symptoms. Return to the ED for any redness, increased warmth, swelling to the joint of if you experience fevers.

## 2019-01-11 NOTE — ED Triage Notes (Addendum)
Pt reports joint pain for about two weeks. Pt reports that the pain started in her neck and went to her shoulders. Pt also reports that both wrists and her knees are also hurting. Pt states the pain worsens at night when she sleeps. Pt reports she did have a sore throat about two days ago. Denies any fevers, cough, shortness of breath at home. Pt reports she does work at [employer], but is unsure if she has come into contact with anyone sick.

## 2019-01-11 NOTE — ED Provider Notes (Signed)
[facility] EMERGENCY DEPARTMENT Provider Note   CSN: [id number] Arrival date & time: 01/11/19  [id number]    History   Chief Complaint Chief Complaint  Patient presents with  . Joint Pain    HPI Norwood LevoWendy W Fullbright is a 48 y.o. female who presents to the ED complaining of diffuse joint pain x 2 weeks, worse in her neck, shoulders, and wrists. Pt reports the pain is constant and worse at nighttime when she is trying to sleep. She states that in the morning when she wakes up she is so stiff that she cannot move any of her joints. She has been told in the past that she has arthritis in her knees but nowhere else. She has been taking Goody powder with some relief. Pt is concerned because she has been unable to work due to the pain. No known injury. She does not have a PCP at this time which prompted her to come to the ED today. Denies fever, chills, redness or increased warmth to her joints, or any other complaints at this time.        Past Medical History:  Diagnosis Date  . Hypercholesteremia   . Hypertension   . Myocardial infarct [facility](HCC)     Patient Active Problem List   Diagnosis Date Noted  . Acute appendicitis 01/07/2014    Past Surgical History:  Procedure Laterality Date  . LAPAROSCOPIC APPENDECTOMY N/A 01/06/2014   Procedure: APPENDECTOMY LAPAROSCOPIC;  Surgeon: Atilano InaEric M Wilson, MD;  [facility];  Service: General;  Laterality: N/A;  . TUBAL LIGATION       OB History   No obstetric history on file.      Home Medications    Prior to Admission medications   Medication Sig Start Date End Date Taking? Authorizing Provider  Aspirin-Salicylamide-Caffeine (BC HEADACHE PO) Take 1 Package by mouth daily as needed. For pain    [provider]  cephALEXin (KEFLEX) 500 MG capsule Take 1 capsule (500 mg total) by mouth 4 (four) times daily. 01/19/15   Elpidio AnisUpstill, Shari, PA-C  IRON PO Take 1 tablet by mouth daily.    [provider]  Multiple  Vitamin (MULTI-VITAMIN DAILY PO) Take 1 tablet by mouth daily.    [provider]  tobramycin (TOBREX) 0.3 % ophthalmic solution Place 2 drops into the left eye every 4 (four) hours. 07/05/15   Elson AreasSofia, Leslie K, PA-C    Family History Family History  Problem Relation Age of Onset  . Diabetes Father   . Heart failure Father   . Hypertension Father     Social History Social History   Tobacco Use  . Smoking status: Former Smoker    Packs/day: 1.00    Types: Cigarettes    Last attempt to quit: 04/15/2018    Years since quitting: 0.7  . Tobacco comment: vapes only now  Substance Use Topics  . Alcohol use: No  . Drug use: No     Allergies   Patient has no known allergies.   Review of Systems Review of Systems  Constitutional: Negative for chills and fever.  Musculoskeletal: Positive for arthralgias, joint swelling and neck pain. Negative for neck stiffness.  Skin: Negative for color change and rash.     Physical Exam Updated Vital Signs BP (!) 157/94 (BP Location: Right Arm)   Pulse 83   Temp 98.2 F (36.8 C) (Oral)   Resp 18   Ht 5\' 1"  (1.549 m)   Wt 69.4 kg  SpO2 100%   BMI 28.91 kg/m   Physical Exam Vitals signs and nursing note reviewed.  Constitutional:      Appearance: She is not ill-appearing.  HENT:     Head: Normocephalic and atraumatic.  Eyes:     Conjunctiva/sclera: Conjunctivae normal.  Neck:     Musculoskeletal: Neck supple.  Cardiovascular:     Rate and Rhythm: Normal rate and regular rhythm.     Pulses: Normal pulses.  Pulmonary:     Effort: Pulmonary effort is normal.     Breath sounds: Normal breath sounds. No wheezing, rhonchi or rales.  Abdominal:     Palpations: Abdomen is soft.     Tenderness: There is no abdominal tenderness. There is no guarding or rebound.  Musculoskeletal: Normal range of motion.     Comments: No C, T, or L spine midline or paraspinal tenderness to palpation. No tenderness to all other joints  including shoulders, elbows, wrists, hips, knees, and ankles.   Skin:    General: Skin is warm and dry.  Neurological:     Mental Status: She is alert.      ED Treatments / Results  Labs (all labs ordered are listed, but only abnormal results are displayed) Labs Reviewed - No data to display  EKG None  Radiology No results found.  Procedures Procedures (including critical care time)  Medications Ordered in ED Medications - No data to display   Initial Impression / Assessment and Plan / ED Course  I have reviewed the triage vital signs and the nursing notes.  Pertinent labs & imaging results that were available during my care of the patient were reviewed by me and considered in my medical decision making (see chart for details).    Pt is a 48 year old female who presents to the ED with diffuse joint pain x 2 weeks; pain varies from location. No recent injury to joints. Worse at nighttime/morning when pt awakens. Suspect OA at this time vs RA although low suspicion for RA given no deformity to joints, redness, fevers. Pt does not have a PCP at this time; will have her follow up with [facility] for further eval. Advised to take Tylenol Arthritis for pain. Pt stable for discharge at this time; mildly hypertensive in the ED; reports hx of same; not on any medication. This is something she should address with PCP as well.        Final Clinical Impressions(s) / ED Diagnoses   Final diagnoses:  Arthralgia, unspecified joint    ED Discharge Orders    None       Tanda Rockers, PA-C 01/11/19 1507    Benjiman Core, MD 01/11/19 1535

## 2019-08-12 IMAGING — DX DG HAND COMPLETE 3+V*L*
3 series · 3 of 3 positions shown · non-contrast
Comparison: None.

CLINICAL DATA: Left ring finger pain.  No injury.

EXAM:
LEFT HAND - COMPLETE 3+ VIEW

[x hand pa left]
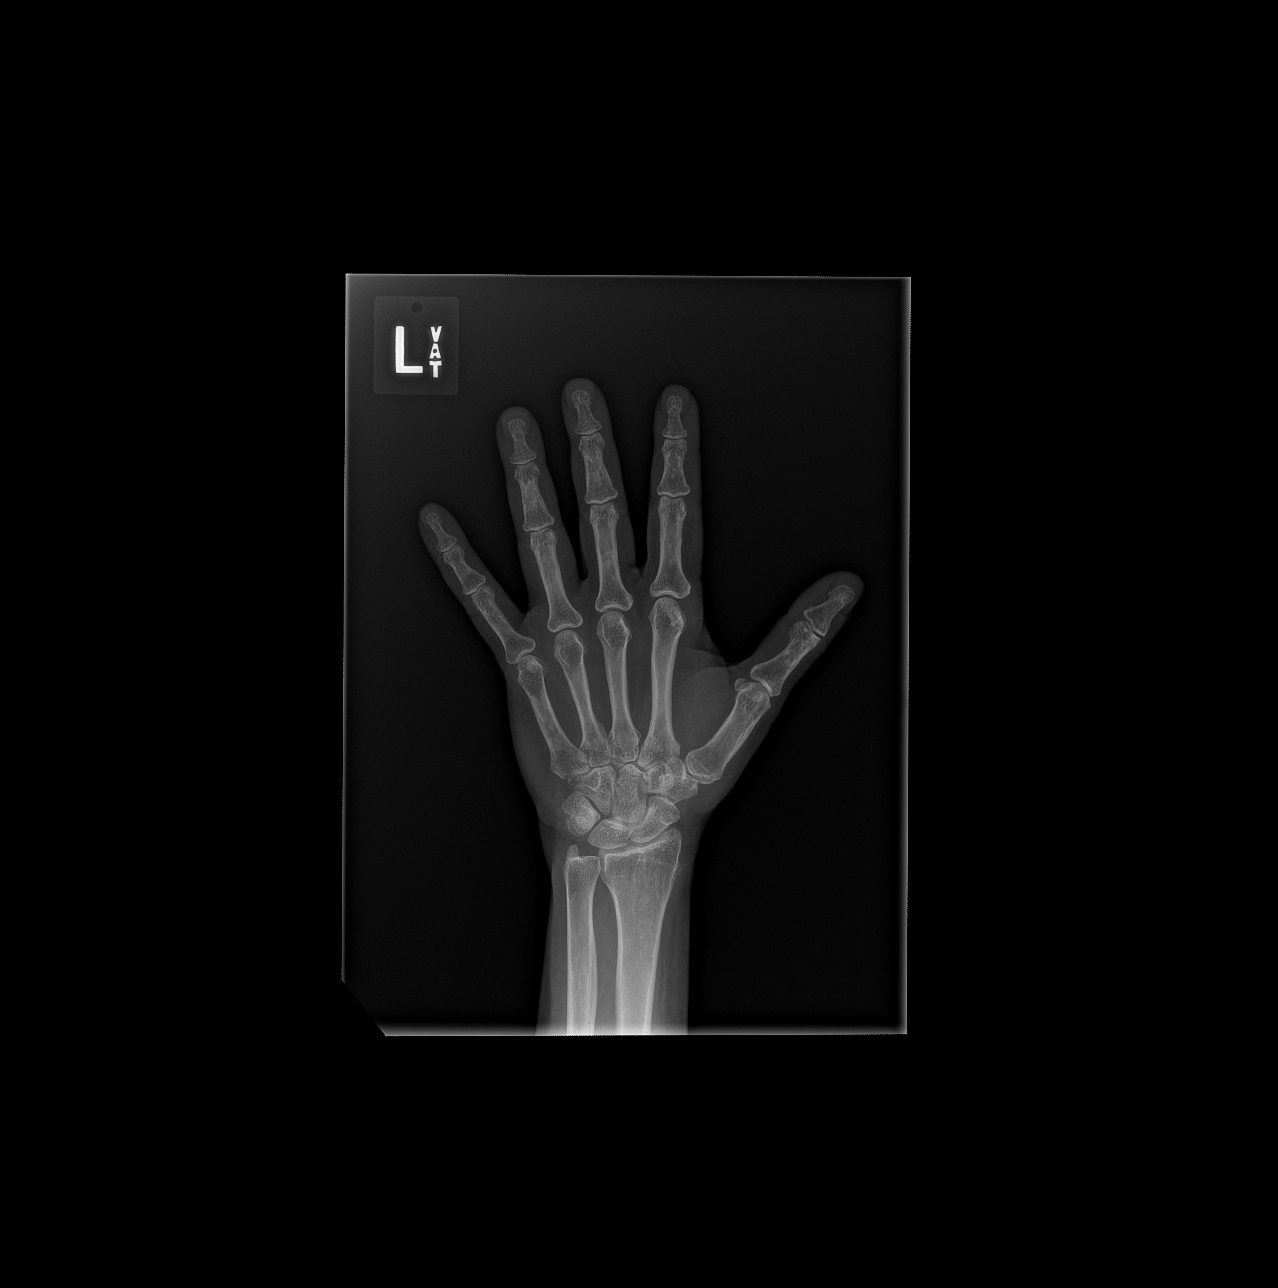

[x hand obl left]
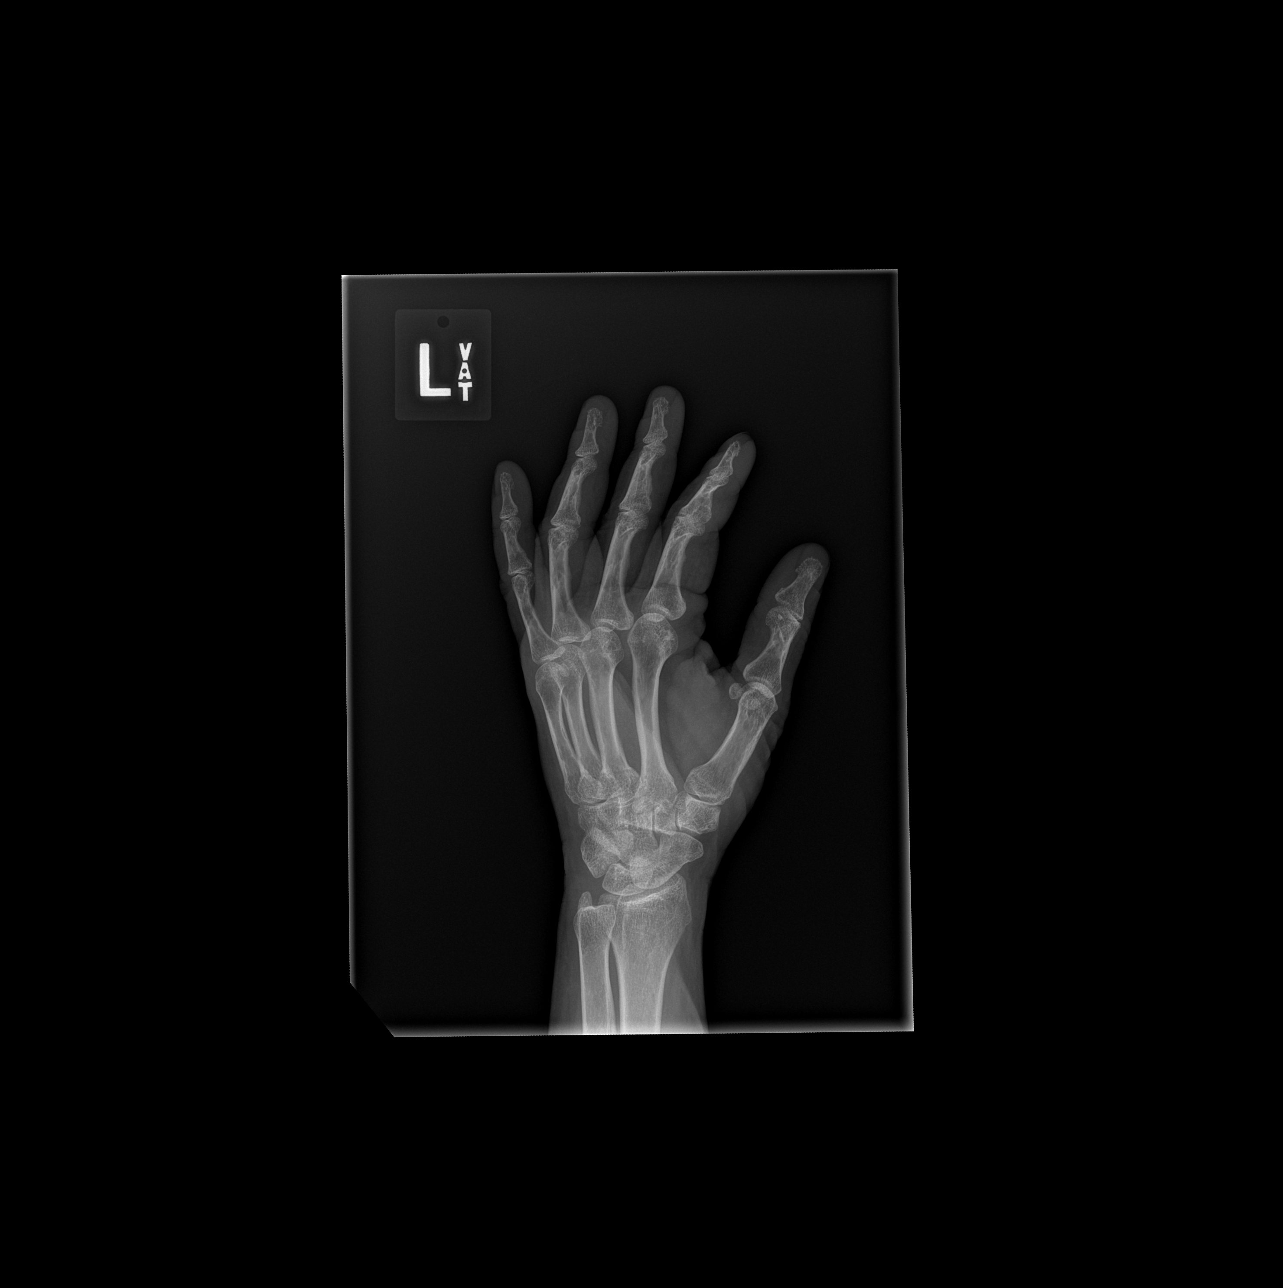

[x hand lat left]
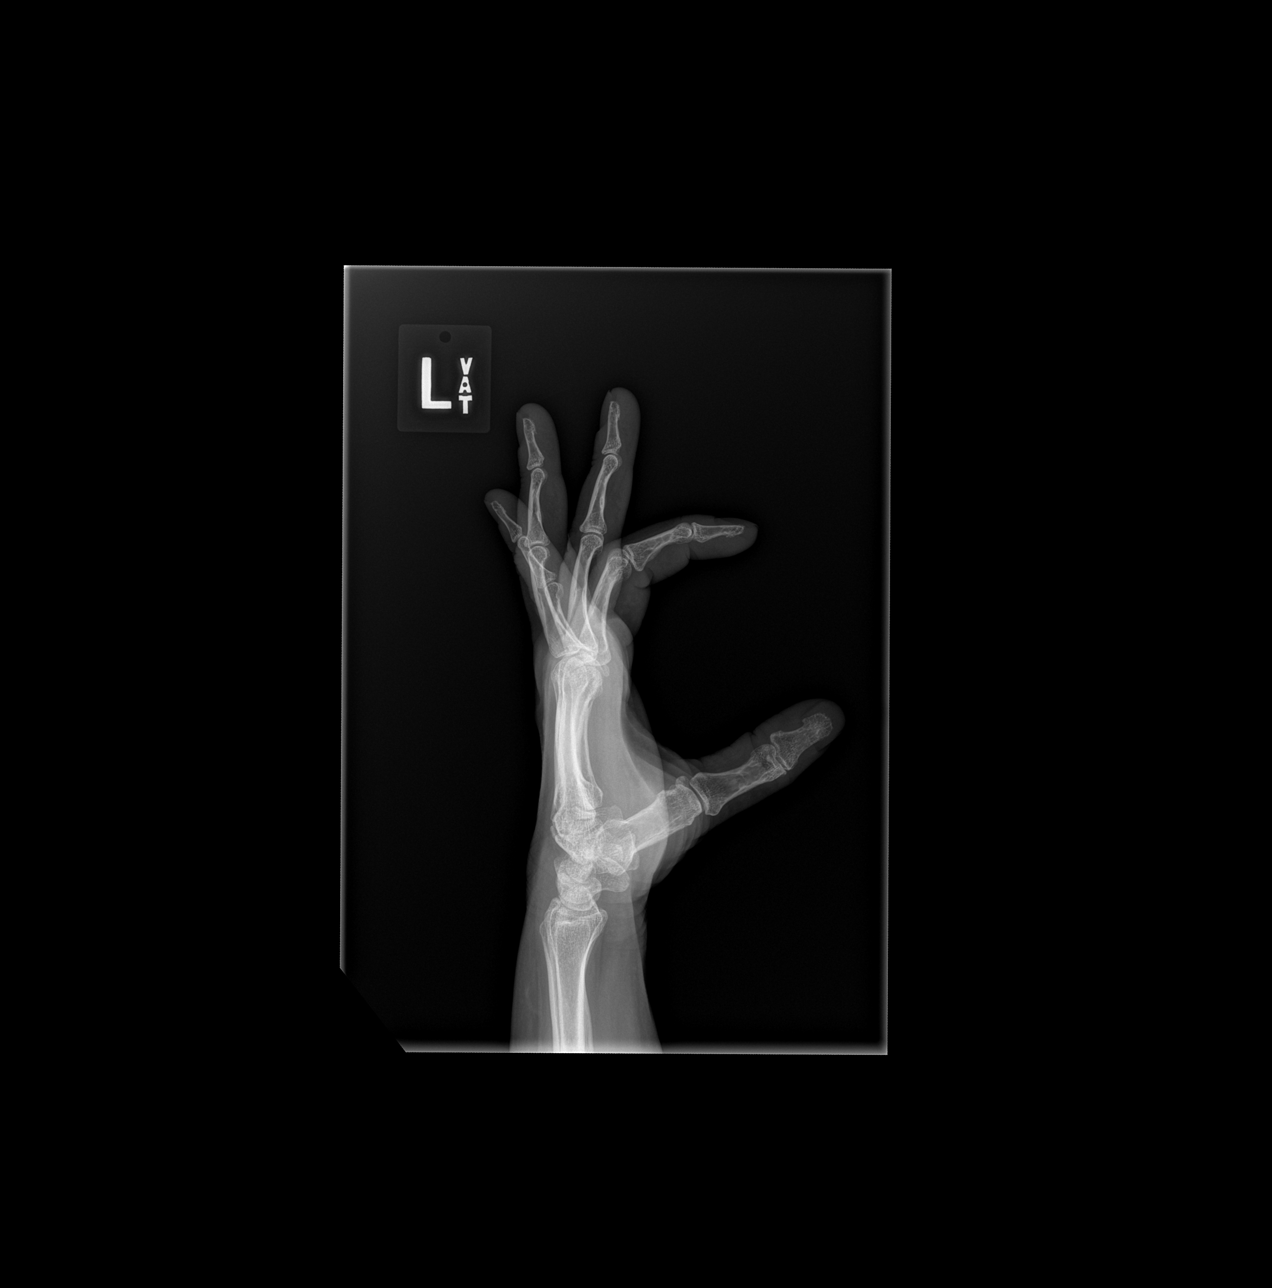

[3 of 3 positions shown; findings below may reference images not displayed]

FINDINGS: There is no evidence of fracture or dislocation. There is no
evidence of arthropathy or other focal bone abnormality. Soft
tissues are unremarkable.
IMPRESSION: No acute findings.
